# Patient Record
Sex: Female | Born: 1950 | Race: White | Hispanic: No | Marital: Married | Smoking: Former smoker
Health system: Southern US, Community
[De-identification: ages and names within clinical notes are randomized; demographics above are authoritative.]

## PROBLEM LIST (undated history)

## (undated) DIAGNOSIS — F419 Anxiety disorder, unspecified: Secondary | ICD-10-CM

## (undated) DIAGNOSIS — Z87442 Personal history of urinary calculi: Secondary | ICD-10-CM

## (undated) DIAGNOSIS — M1990 Unspecified osteoarthritis, unspecified site: Secondary | ICD-10-CM

## (undated) DIAGNOSIS — R011 Cardiac murmur, unspecified: Secondary | ICD-10-CM

## (undated) DIAGNOSIS — F32A Depression, unspecified: Secondary | ICD-10-CM

## (undated) DIAGNOSIS — E039 Hypothyroidism, unspecified: Secondary | ICD-10-CM

## (undated) DIAGNOSIS — I10 Essential (primary) hypertension: Secondary | ICD-10-CM

## (undated) DIAGNOSIS — I358 Other nonrheumatic aortic valve disorders: Secondary | ICD-10-CM

## (undated) DIAGNOSIS — N189 Chronic kidney disease, unspecified: Secondary | ICD-10-CM

## (undated) DIAGNOSIS — Z9889 Other specified postprocedural states: Secondary | ICD-10-CM

## (undated) DIAGNOSIS — K759 Inflammatory liver disease, unspecified: Secondary | ICD-10-CM

## (undated) DIAGNOSIS — C801 Malignant (primary) neoplasm, unspecified: Secondary | ICD-10-CM

## (undated) DIAGNOSIS — D649 Anemia, unspecified: Secondary | ICD-10-CM

## (undated) DIAGNOSIS — K219 Gastro-esophageal reflux disease without esophagitis: Secondary | ICD-10-CM

## (undated) DIAGNOSIS — R112 Nausea with vomiting, unspecified: Secondary | ICD-10-CM

## (undated) DIAGNOSIS — M549 Dorsalgia, unspecified: Secondary | ICD-10-CM

## (undated) DIAGNOSIS — E785 Hyperlipidemia, unspecified: Secondary | ICD-10-CM

## (undated) HISTORY — DX: Hyperlipidemia, unspecified: E78.5

## (undated) HISTORY — PX: TUBAL LIGATION: SHX77

## (undated) HISTORY — DX: Anxiety disorder, unspecified: F41.9

## (undated) HISTORY — PX: SPINE SURGERY: SHX786

## (undated) HISTORY — PX: TONSILLECTOMY: SUR1361

## (undated) HISTORY — DX: Anemia, unspecified: D64.9

---

## 2006-02-07 HISTORY — PX: ABDOMINAL HYSTERECTOMY: SHX81

## 2011-02-08 HISTORY — PX: BREAST SURGERY: SHX581

## 2011-04-18 DIAGNOSIS — E785 Hyperlipidemia, unspecified: Secondary | ICD-10-CM | POA: Insufficient documentation

## 2011-04-18 DIAGNOSIS — E039 Hypothyroidism, unspecified: Secondary | ICD-10-CM | POA: Insufficient documentation

## 2011-04-18 DIAGNOSIS — N209 Urinary calculus, unspecified: Secondary | ICD-10-CM | POA: Insufficient documentation

## 2011-06-16 DIAGNOSIS — K219 Gastro-esophageal reflux disease without esophagitis: Secondary | ICD-10-CM

## 2011-06-16 HISTORY — DX: Gastro-esophageal reflux disease without esophagitis: K21.9

## 2011-06-20 HISTORY — PX: BREAST SURGERY: SHX581

## 2011-10-26 DIAGNOSIS — N200 Calculus of kidney: Secondary | ICD-10-CM | POA: Insufficient documentation

## 2012-11-06 DIAGNOSIS — F32A Depression, unspecified: Secondary | ICD-10-CM

## 2012-11-06 HISTORY — DX: Depression, unspecified: F32.A

## 2013-07-02 DIAGNOSIS — K635 Polyp of colon: Secondary | ICD-10-CM | POA: Insufficient documentation

## 2016-02-08 DIAGNOSIS — M549 Dorsalgia, unspecified: Secondary | ICD-10-CM

## 2016-02-08 HISTORY — DX: Dorsalgia, unspecified: M54.9

## 2016-04-11 DIAGNOSIS — F419 Anxiety disorder, unspecified: Secondary | ICD-10-CM

## 2016-04-11 HISTORY — DX: Anxiety disorder, unspecified: F41.9

## 2016-10-18 DIAGNOSIS — D649 Anemia, unspecified: Secondary | ICD-10-CM

## 2016-10-18 HISTORY — DX: Anemia, unspecified: D64.9

## 2018-05-23 DIAGNOSIS — I358 Other nonrheumatic aortic valve disorders: Secondary | ICD-10-CM | POA: Insufficient documentation

## 2018-05-23 HISTORY — DX: Other nonrheumatic aortic valve disorders: I35.8

## 2020-04-10 DIAGNOSIS — N1832 Chronic kidney disease, stage 3b: Secondary | ICD-10-CM | POA: Insufficient documentation

## 2020-05-13 ENCOUNTER — Ambulatory Visit (INDEPENDENT_AMBULATORY_CARE_PROVIDER_SITE_OTHER): Payer: Medicare Other | Admitting: Orthopaedic Surgery

## 2020-05-13 ENCOUNTER — Ambulatory Visit (INDEPENDENT_AMBULATORY_CARE_PROVIDER_SITE_OTHER): Payer: Medicare Other

## 2020-05-13 VITALS — BP 144/79 | HR 92 | Ht 65.0 in | Wt 176.0 lb

## 2020-05-13 DIAGNOSIS — M1711 Unilateral primary osteoarthritis, right knee: Secondary | ICD-10-CM | POA: Insufficient documentation

## 2020-05-13 DIAGNOSIS — M25561 Pain in right knee: Secondary | ICD-10-CM

## 2020-05-13 NOTE — Progress Notes (Signed)
Office Visit Note   Patient: Vanessa [name]           Date of Birth: 1950/04/03           MRN: [id number] Visit Date: 05/13/2020              Requested by: No referring provider defined for this encounter. PCP: No primary care provider on file.   Assessment & Plan: Visit Diagnoses:  1. Acute pain of right knee   2. Unilateral primary osteoarthritis, right knee     Plan: Right knee injection performed which she tolerated well.  She will let us know she has persistent symptoms.  She has fairly significant knee osteoarthritis.  She likely may have some degenerative meniscal tearing as well.  Hopefully she will settle down with intra-articular injection since she cannot take anti-inflammatories.  Follow-Up Instructions: Return if symptoms worsen or fail to improve.   Orders:  Orders Placed This Encounter  Procedures  . XR KNEE 3 VIEW RIGHT   No orders of the defined types were placed in this encounter.     Procedures: Large Joint Inj: R knee on 05/14/2020 12:17 PM Indications: pain and joint swelling Details: 22 G 1.5 in needle, anterolateral approach  Arthrogram: No  Medications: 40 mg methylPREDNISolone acetate 40 MG/ML; 0.5 mL lidocaine 1 %; 4 mL bupivacaine 0.25 % Outcome: tolerated well, no immediate complications Procedure, treatment alternatives, risks and benefits explained, specific risks discussed. Consent was given by the patient. Immediately prior to procedure a time out was called to verify the correct patient, procedure, equipment, support staff and site/side marked as required. Patient was prepped and draped in the usual sterile fashion.       Clinical Data: No additional findings.   Subjective: Chief Complaint  Patient presents with  . Right Knee - Pain    HPI 70 year old female previous ACL reconstruction right knee more than 20 years ago twisted her knee over the weekend and suddenly could not weight-bear.  She had swelling in her knee has  gotten out crutches and has to use them for ambulation.  She is used Aspercreme Tylenol and aspirin.  She has some chronic kidney disease stage III-IV and avoids anti-inflammatories.  No true locking but swelling pain and catching and pain with weightbearing.  She denies chills or fever no history of gout.  Onset was sudden and occurred when she twisted her knee.  Review of Systems possible kidney disease since last seen all other systems noncontributory to HPI.   Objective: Vital Signs: BP (!) 144/79   Pulse 92   Ht 5\' 5"  (1.651 m)   Wt 176 lb (79.8 kg)   BMI 29.29 kg/m   Physical Exam Constitutional:      Appearance: She is well-developed.  HENT:     Head: Normocephalic.     Right Ear: External ear normal.     Left Ear: External ear normal.  Eyes:     Pupils: Pupils are equal, round, and reactive to light.  Neck:     Thyroid: No thyromegaly.     Trachea: No tracheal deviation.  Cardiovascular:     Rate and Rhythm: Normal rate.  Pulmonary:     Effort: Pulmonary effort is normal.  Abdominal:     Palpations: Abdomen is soft.  Skin:    General: Skin is warm and dry.  Neurological:     Mental Status: She is alert and oriented to person, place, and time.  Psychiatric:  Behavior: Behavior normal.     Ortho Exam there is a 2+ knee effusion.  Well-healed ACL incision.  Negative anterior drawer negative Lachman she does reach full extension she has pain with patellar compression and pain and crepitus with knee flexion extension.  Specialty Comments:  No specialty comments available.  Imaging: No results found.   PMFS History: Patient Active Problem List   Diagnosis Date Noted  . Unilateral primary osteoarthritis, right knee 05/13/2020   No past medical history on file.  No family history on file.   Social History   Occupational History  . Not on file  Tobacco Use  . Smoking status: Not on file  . Smokeless tobacco: Not on file  Substance and Sexual  Activity  . Alcohol use: Not on file  . Drug use: Not on file  . Sexual activity: Not on file

## 2020-05-14 DIAGNOSIS — M1711 Unilateral primary osteoarthritis, right knee: Secondary | ICD-10-CM

## 2020-05-14 MED ORDER — METHYLPREDNISOLONE ACETATE 40 MG/ML IJ SUSP
40.0000 mg | INTRAMUSCULAR | Status: AC | PRN
  Administered 2020-05-14: 40 mg via INTRA_ARTICULAR

## 2020-05-14 MED ORDER — BUPIVACAINE HCL 0.25 % IJ SOLN
4.0000 mL | INTRAMUSCULAR | Status: AC | PRN
  Administered 2020-05-14: 4 mL via INTRA_ARTICULAR

## 2020-05-14 MED ORDER — LIDOCAINE HCL 1 % IJ SOLN
0.5000 mL | INTRAMUSCULAR | Status: AC | PRN
  Administered 2020-05-14: .5 mL

## 2020-05-19 ENCOUNTER — Ambulatory Visit: Payer: Self-pay | Admitting: Orthopaedic Surgery

## 2020-05-25 DIAGNOSIS — I10 Essential (primary) hypertension: Secondary | ICD-10-CM | POA: Insufficient documentation

## 2020-05-25 DIAGNOSIS — F419 Anxiety disorder, unspecified: Secondary | ICD-10-CM | POA: Insufficient documentation

## 2020-09-30 ENCOUNTER — Other Ambulatory Visit: Payer: Self-pay

## 2020-09-30 ENCOUNTER — Ambulatory Visit (INDEPENDENT_AMBULATORY_CARE_PROVIDER_SITE_OTHER): Payer: Medicare Other

## 2020-09-30 ENCOUNTER — Ambulatory Visit (INDEPENDENT_AMBULATORY_CARE_PROVIDER_SITE_OTHER): Payer: Medicare Other | Admitting: Orthopedic Surgery

## 2020-09-30 ENCOUNTER — Encounter: Payer: Self-pay | Admitting: Orthopedic Surgery

## 2020-09-30 DIAGNOSIS — M25511 Pain in right shoulder: Secondary | ICD-10-CM

## 2020-09-30 NOTE — Progress Notes (Signed)
Office Visit Note   Patient: Vanessa [name]           Date of Birth: Aug 31, 1950           MRN: [id number] Visit Date: 09/30/2020 Requested by: No referring provider defined for this encounter. PCP: No primary care provider on file.  Subjective: Chief Complaint  Patient presents with   Right Shoulder - Pain    HPI: Vanessa [name] is a 70 year old patient with right shoulder pain.  Shoulder pain has been going on since April.  She was doing some stretching and felt a pop in her shoulder.  Pain comes and goes.  Cold and damp hair causes increased in the pain.  Describes painful range of motion.  Does not report frequent mechanical symptoms in the shoulder but localizes the pain to the bicipital groove region just above the pec tendon attachment site.  Extension and circumduction hurt the right shoulder.  Denies any neck pain or radicular symptoms.  Has a history of posterior lipoma removal.  Cannot take anti-inflammatories because of chronic renal disease.  Does take Tylenol and aspirin.              ROS: All systems reviewed are negative as they relate to the chief complaint within the history of present illness.  Patient denies  fevers or chills.   Assessment & Plan: Visit Diagnoses:  1. Right shoulder pain, unspecified chronicity     Plan: Impression is right shoulder pain which looks like biceps tendon related.  No Popeye deformity present with good rotator cuff strength and maintenance of passive range of motion.  Symptoms have been ongoing now for 4 months.  She has tried medication as well as a home exercise program of stretching as well as activity modification.  Nonetheless symptoms have been progressive and are worsening.  Plan MRI arthrogram of the right shoulder to evaluate that biceps tendon in the region of the top of the pec attachment.  Could also look at the intra-articular portion as well.  Follow-up after that study. Follow-Up Instructions: Return for after MRI.    Orders:  Orders Placed This Encounter  Procedures   XR Shoulder Right   MR SHOULDER RIGHT W CONTRAST   Arthrogram   No orders of the defined types were placed in this encounter.     Procedures: No procedures performed   Clinical Data: No additional findings.  Objective: Vital Signs: There were no vitals taken for this visit.  Physical Exam:   Constitutional: Patient appears well-developed HEENT:  Head: Normocephalic Eyes:EOM are normal Neck: Normal range of motion Cardiovascular: Normal rate Pulmonary/chest: Effort normal Neurologic: Patient is alert Skin: Skin is warm Psychiatric: Patient has normal mood and affect   Ortho Exam: Ortho exam demonstrates full active and passive range of motion of the shoulder.  Her passive range of motion is 60/95/170.  Rotator cuff strength is intact infraspinatus supraspinatus and subscap muscle testing with no discrete AC joint tenderness.  She is got a little bit more coarseness and grinding with internal and external rotation at 90 degrees of abduction right versus left.  Still no discrete weakness.  O'Brien's testing negative on the right negative on left.  She does have mild pain with resisted supination on the right compared to the left.  Specialty Comments:  No specialty comments available.  Imaging: XR Shoulder Right  Result Date: 09/30/2020 AP axillary outlet radiographs right shoulder reviewed.  Acromiohumeral distance normal.  No AC joint or glenohumeral joint arthritis.  No fracture or dislocation.  Visualized lung fields clear.  Normal radiographs right shoulder    PMFS History: Patient Active Problem List   Diagnosis Date Noted   Unilateral primary osteoarthritis, right knee 05/13/2020   History reviewed. No pertinent past medical history.  History reviewed. No pertinent family history.  History reviewed. No pertinent surgical history. Social History   Occupational History   Not on file  Tobacco Use    Smoking status: Not on file   Smokeless tobacco: Not on file  Substance and Sexual Activity   Alcohol use: Not on file   Drug use: Not on file   Sexual activity: Not on file

## 2020-10-01 ENCOUNTER — Encounter: Payer: Self-pay | Admitting: Orthopedic Surgery

## 2020-10-01 NOTE — Telephone Encounter (Signed)
Ok for no contrast pls call thx

## 2020-10-02 NOTE — Telephone Encounter (Signed)
Order has been changed to wo contrast and I cancelled order for arthogram

## 2020-10-26 ENCOUNTER — Inpatient Hospital Stay: Admission: RE | Admit: 2020-10-26 | Payer: Medicare Other | Source: Ambulatory Visit

## 2020-10-26 ENCOUNTER — Ambulatory Visit
Admission: RE | Admit: 2020-10-26 | Discharge: 2020-10-26 | Disposition: A | Discharge status: Home / Self Care | Payer: Medicare Other | Source: Ambulatory Visit | Attending: Orthopedic Surgery | Admitting: Orthopedic Surgery

## 2020-10-26 ENCOUNTER — Other Ambulatory Visit: Payer: Self-pay

## 2020-10-26 DIAGNOSIS — M25511 Pain in right shoulder: Secondary | ICD-10-CM

## 2020-11-11 ENCOUNTER — Other Ambulatory Visit: Payer: Self-pay

## 2020-11-11 ENCOUNTER — Ambulatory Visit (INDEPENDENT_AMBULATORY_CARE_PROVIDER_SITE_OTHER): Payer: Medicare Other | Admitting: Orthopedic Surgery

## 2020-11-11 DIAGNOSIS — M75121 Complete rotator cuff tear or rupture of right shoulder, not specified as traumatic: Secondary | ICD-10-CM | POA: Diagnosis not present

## 2020-11-15 ENCOUNTER — Encounter: Payer: Self-pay | Admitting: Orthopedic Surgery

## 2020-11-15 NOTE — Progress Notes (Signed)
Office Visit Note   Patient: Vanessa [name]           Date of Birth: 1950-07-30           MRN: [id number] Visit Date: 11/11/2020 Requested by: Berdie Ogren, [place],  [place] [address] PCP: Hix, Jonna Munro, MD  Subjective: Chief Complaint  Patient presents with   Other    Scan review    HPI: Lazette is a patient with right shoulder pain.  Since she was last seen she has had an MRI scan of the right shoulder which demonstrates subscapularis torn and retracted up to 3 cm.  Severe fatty atrophy of the subscapularis is present.  She also has full-thickness retracted tear of the supraspinatus with maximal retraction around a centimeter and a half.  Biceps tendon is dislocated medially.  Moderate effusion is present.  Patient reports he likes to golf walk and do exercise classes and Pilates.  Pain is a more significant component of her symptomatology than weakness.  She cannot take anti-inflammatories.  She does take aspirin and Tylenol for her symptoms.              ROS: All systems reviewed are negative as they relate to the chief complaint within the history of present illness.  Patient denies  fevers or chills.   Assessment & Plan: Visit Diagnoses:  1. Complete tear of right rotator cuff, unspecified whether traumatic     Plan: Impression is right shoulder rotator cuff tear anterior superior pathology.  Biceps tendon dislocated medially.  Plan arthroscopy with debridement and release of the biceps tendon with mini open rotator cuff tear repair of the supraspinatus.  Subscapularis is retracted and unlikely to be able to be mobilized to the point to achieve repair.  Patient understands the risk and benefits.  I think if we can get a watertight/operative repair of the supraspinatus then she would have reasonable but not perfect function.  The risk and benefits of the procedure are discussed including not limited to infection nerve vessel damage shoulder  stiffness as well as possibility of further surgery being required.  All questions answered.  Follow-Up Instructions: No follow-ups on file.   Orders:  No orders of the defined types were placed in this encounter.  No orders of the defined types were placed in this encounter.     Procedures: No procedures performed   Clinical Data: No additional findings.  Objective: Vital Signs: There were no vitals taken for this visit.  Physical Exam:   Constitutional: Patient appears well-developed HEENT:  Head: Normocephalic Eyes:EOM are normal Neck: Normal range of motion Cardiovascular: Normal rate Pulmonary/chest: Effort normal Neurologic: Patient is alert Skin: Skin is warm Psychiatric: Patient has normal mood and affect   Ortho Exam: Ortho exam demonstrates good cervical spine range of motion.  Motor or sensory function to the hand is intact.  Passive range of motion on the right is 85/90/170.  She does have slight weakness to the subscap testing but not as much as 1 would expect based on her findings.  She has good infraspinatus strength and 4 out of 5 supraspinatus strength.  No Popeye deformity is present.  No AC joint tenderness is present.  Specialty Comments:  No specialty comments available.  Imaging: No results found.   PMFS History: Patient Active Problem List   Diagnosis Date Noted   Unilateral primary osteoarthritis, right knee 05/13/2020   No past medical history on file.  No family  history on file.  No past surgical history on file. Social History   Occupational History   Not on file  Tobacco Use   Smoking status: Not on file   Smokeless tobacco: Not on file  Substance and Sexual Activity   Alcohol use: Not on file   Drug use: Not on file   Sexual activity: Not on file

## 2021-01-05 ENCOUNTER — Encounter: Payer: Self-pay | Admitting: Orthopedic Surgery

## 2021-01-05 NOTE — Telephone Encounter (Signed)
Depending on where she is done  Should be outpt tho  - if cone she will need to do all that beforehand

## 2021-01-12 ENCOUNTER — Encounter (HOSPITAL_COMMUNITY): Payer: Self-pay

## 2021-01-12 NOTE — Pre-Procedure Instructions (Signed)
Surgical Instructions    Your procedure is scheduled on Friday, December 23rd.  Report to [facility] Main Entrance "A" at 05:30 A.M., then check in with the Admitting office.  Call this number if you have problems the morning of surgery:  [phone]   If you have any questions prior to your surgery date call [phone]: Open Monday-Friday 8am-4pm    Remember:  Do not eat after midnight the night before your surgery  You may drink clear liquids until 04:30 AM the morning of your surgery.   Clear liquids allowed are: Water, Non-Citrus Juices (without pulp), Carbonated Beverages, Clear Tea, Black Coffee Only, and Gatorade  Patient Instructions  The night before surgery:  No food after midnight. ONLY clear liquids after midnight  The day of surgery (if you do NOT have diabetes):  Drink ONE (1) Pre-Surgery Clear Ensure by 04:30 AM the morning of surgery. Drink in one sitting. Do not sip.  This drink was given to you during your hospital  pre-op appointment visit.  Nothing else to drink after completing the  Pre-Surgery Clear Ensure.          If you have questions, please contact your surgeon's office.     Take these medicines the morning of surgery with A SIP OF WATER  atorvastatin (LIPITOR) diltiazem (CARDIZEM CD)  famotidine (PEPCID) hydrALAZINE (APRESOLINE)  levothyroxine (SYNTHROID)  venlafaxine XR (EFFEXOR-XR)    If needed: acetaminophen (TYLENOL)  loratadine (CLARITIN)  ondansetron (ZOFRAN-ODT)   As of today, STOP taking any Aspirin (unless otherwise instructed by your surgeon) Aleve, Naproxen, Ibuprofen, Motrin, Advil, Goody's, BC's, all herbal medications, fish oil, and all vitamins.                     Do NOT Smoke (Tobacco/Vaping) or drink Alcohol 24 hours prior to your procedure.  If you use a CPAP at night, you may bring all equipment for your overnight stay.   Contacts, glasses, piercing's, hearing aid's, dentures or partials may not be worn into  surgery, please bring cases for these belongings.    For patients admitted to the hospital, discharge time will be determined by your treatment team.   Patients discharged the day of surgery will not be allowed to drive home, and someone needs to stay with them for 24 hours.  NO VISITORS WILL BE ALLOWED IN PRE-OP WHERE PATIENTS GET READY FOR SURGERY.  ONLY 1 SUPPORT PERSON MAY BE PRESENT IN THE WAITING ROOM WHILE YOU ARE IN SURGERY.  IF YOU ARE TO BE ADMITTED, ONCE YOU ARE IN YOUR ROOM YOU WILL BE ALLOWED TWO (2) VISITORS.  Minor children may have two parents present. Special consideration for safety and communication needs will be reviewed on a case by case basis.   Special instructions:   [place]- Preparing For Surgery  Before surgery, you can play an important role. Because skin is not sterile, your skin needs to be as free of germs as possible. You can reduce the number of germs on your skin by washing with CHG (chlorahexidine gluconate) Soap before surgery.  CHG is an antiseptic cleaner which kills germs and bonds with the skin to continue killing germs even after washing.    Oral Hygiene is also important to reduce your risk of infection.  Remember - BRUSH YOUR TEETH THE MORNING OF SURGERY WITH YOUR REGULAR TOOTHPASTE  Please do not use if you have an allergy to CHG or antibacterial soaps. If your skin becomes reddened/irritated stop using the  CHG.  Do not shave (including legs and underarms) for at least 48 hours prior to first CHG shower. It is OK to shave your face.  Please follow these instructions carefully.   Shower the NIGHT BEFORE SURGERY and the MORNING OF SURGERY  If you chose to wash your hair, wash your hair first as usual with your normal shampoo.  After you shampoo, rinse your hair and body thoroughly to remove the shampoo.  Use CHG Soap as you would any other liquid soap. You can apply CHG directly to the skin and wash gently with a scrungie or a clean washcloth.    Apply the CHG Soap to your body ONLY FROM THE NECK DOWN.  Do not use on open wounds or open sores. Avoid contact with your eyes, ears, mouth and genitals (private parts). Wash Face and genitals (private parts)  with your normal soap.   Wash thoroughly, paying special attention to the area where your surgery will be performed.  Thoroughly rinse your body with warm water from the neck down.  DO NOT shower/wash with your normal soap after using and rinsing off the CHG Soap.  Pat yourself dry with a CLEAN TOWEL.  Wear CLEAN PAJAMAS to bed the night before surgery  Place CLEAN SHEETS on your bed the night before your surgery  DO NOT SLEEP WITH PETS.   Day of Surgery: Shower with CHG soap. Do not wear jewelry, make up, nail polish, gel polish, artificial nails, or any other type of covering on natural nails including finger and toenails. If patients have artificial nails, gel coating, etc. that need to be removed by a nail salon please have this removed prior to surgery. Surgery may need to be canceled/delayed if the surgeon/ anesthesia feels like the patient is unable to be adequately monitored. Do not wear lotions, powders, perfumes, or deodorant. Do not shave 48 hours prior to surgery.   Do not bring valuables to the hospital. [facility] is not responsible for any belongings or valuables. Wear Clean/Comfortable clothing the morning of surgery Remember to brush your teeth WITH YOUR REGULAR TOOTHPASTE.   Please read over the following fact sheets that you were given.   3 days prior to your procedure or After your COVID test   You are not required to quarantine however you are required to wear a well-fitting mask when you are out and around people not in your household. If your mask becomes wet or soiled, replace with a new one.   Wash your hands often with soap and water for 20 seconds or clean your hands with an alcohol-based hand sanitizer that contains at least 60%  alcohol.   Do not share personal items.   Notify your provider:  o if you are in close contact with someone who has COVID  o or if you develop a fever of 100.4 or greater, sneezing, cough, sore throat, shortness of breath or body aches.

## 2021-01-13 ENCOUNTER — Encounter (HOSPITAL_COMMUNITY)
Admission: RE | Admit: 2021-01-13 | Discharge: 2021-01-13 | Disposition: A | Discharge status: Home / Self Care | Payer: Medicare Other | Source: Ambulatory Visit | Attending: Orthopedic Surgery | Admitting: Orthopedic Surgery

## 2021-01-13 ENCOUNTER — Telehealth: Payer: Self-pay | Admitting: Orthopedic Surgery

## 2021-01-13 ENCOUNTER — Other Ambulatory Visit: Payer: Self-pay

## 2021-01-13 ENCOUNTER — Encounter: Payer: Self-pay | Admitting: Orthopedic Surgery

## 2021-01-13 ENCOUNTER — Encounter (HOSPITAL_COMMUNITY): Payer: Self-pay

## 2021-01-13 VITALS — BP 146/65 | HR 95 | Temp 98.4°F | Resp 17 | Ht 65.0 in | Wt 167.0 lb

## 2021-01-13 DIAGNOSIS — Z87891 Personal history of nicotine dependence: Secondary | ICD-10-CM | POA: Diagnosis not present

## 2021-01-13 DIAGNOSIS — Z01818 Encounter for other preprocedural examination: Secondary | ICD-10-CM | POA: Insufficient documentation

## 2021-01-13 DIAGNOSIS — K219 Gastro-esophageal reflux disease without esophagitis: Secondary | ICD-10-CM | POA: Diagnosis not present

## 2021-01-13 DIAGNOSIS — N183 Chronic kidney disease, stage 3 unspecified: Secondary | ICD-10-CM | POA: Insufficient documentation

## 2021-01-13 DIAGNOSIS — I129 Hypertensive chronic kidney disease with stage 1 through stage 4 chronic kidney disease, or unspecified chronic kidney disease: Secondary | ICD-10-CM | POA: Diagnosis not present

## 2021-01-13 DIAGNOSIS — E039 Hypothyroidism, unspecified: Secondary | ICD-10-CM | POA: Diagnosis not present

## 2021-01-13 HISTORY — DX: Other specified postprocedural states: Z98.890

## 2021-01-13 HISTORY — DX: Personal history of urinary calculi: Z87.442

## 2021-01-13 HISTORY — DX: Cardiac murmur, unspecified: R01.1

## 2021-01-13 HISTORY — DX: Chronic kidney disease, unspecified: N18.9

## 2021-01-13 HISTORY — DX: Gastro-esophageal reflux disease without esophagitis: K21.9

## 2021-01-13 HISTORY — DX: Essential (primary) hypertension: I10

## 2021-01-13 HISTORY — DX: Hypothyroidism, unspecified: E03.9

## 2021-01-13 HISTORY — DX: Inflammatory liver disease, unspecified: K75.9

## 2021-01-13 HISTORY — DX: Depression, unspecified: F32.A

## 2021-01-13 HISTORY — DX: Unspecified osteoarthritis, unspecified site: M19.90

## 2021-01-13 HISTORY — DX: Nausea with vomiting, unspecified: R11.2

## 2021-01-13 HISTORY — DX: Malignant (primary) neoplasm, unspecified: C80.1

## 2021-01-13 HISTORY — DX: Other nonrheumatic aortic valve disorders: I35.8

## 2021-01-13 LAB — COMPREHENSIVE METABOLIC PANEL
ALT: 27 U/L (ref 0–44)
AST: 28 U/L (ref 15–41)
Albumin: 4 g/dL (ref 3.5–5.0)
Alkaline Phosphatase: 69 U/L (ref 38–126)
Anion gap: 8 (ref 5–15)
BUN: 25 mg/dL — ABNORMAL HIGH (ref 8–23)
CO2: 25 mmol/L (ref 22–32)
Calcium: 9.7 mg/dL (ref 8.9–10.3)
Chloride: 107 mmol/L (ref 98–111)
Creatinine, Ser: 1.53 mg/dL — ABNORMAL HIGH (ref 0.44–1.00)
GFR, Estimated: 37 mL/min — ABNORMAL LOW (ref >60)
Glucose, Bld: 120 mg/dL — ABNORMAL HIGH (ref 70–99)
Potassium: 4.4 mmol/L (ref 3.5–5.1)
Sodium: 140 mmol/L (ref 135–145)
Total Bilirubin: 0.4 mg/dL (ref 0.3–1.2)
Total Protein: 6.8 g/dL (ref 6.5–8.1)

## 2021-01-13 LAB — CBC
HCT: 35.6 % — ABNORMAL LOW (ref 36.0–46.0)
Hemoglobin: 11.7 g/dL — ABNORMAL LOW (ref 12.0–15.0)
MCH: 29 pg (ref 26.0–34.0)
MCHC: 32.9 g/dL (ref 30.0–36.0)
MCV: 88.1 fL (ref 80.0–100.0)
Platelets: 334 10*3/uL (ref 150–400)
RBC: 4.04 MIL/uL (ref 3.87–5.11)
RDW: 12.8 % (ref 11.5–15.5)
WBC: 9.3 10*3/uL (ref 4.0–10.5)
nRBC: 0 % (ref 0.0–0.2)

## 2021-01-13 NOTE — Telephone Encounter (Signed)
Patient called advised she is having surgery 01-29-2021 and want to know when should she stop taking Aspirin? The number to contact patient is [phone]

## 2021-01-13 NOTE — Telephone Encounter (Signed)
5 days prior to surgeyr

## 2021-01-13 NOTE — Telephone Encounter (Signed)
Patient is scheduled to have a right shoulder scope preformed on 01/29/2021 and would like to know when she should stop taking ASA

## 2021-01-13 NOTE — Progress Notes (Signed)
PCP: Jonna Munro Hix, MD Cardiologist: denies  EKG:01/13/21 CXR: na ECHO: 12/04/17 Stress Test: denies Cardiac Cath: denies  Fasting Blood Sugar- na Checks Blood Sugar_na__ times a day  ASA: Patient will call office for instructions Blood Thinner: No  OSA/CPAP: No  Covid test not needed - Ambulatory  Anesthesia Review: Yes, heart murmur. Had Echo due to murmur but patient states did not see cardiologist and no follow up required.   Patient denies shortness of breath, fever, cough, and chest pain at PAT appointment.  Patient verbalized understanding of instructions provided today at the PAT appointment.  Patient asked to review instructions at home and day of surgery.

## 2021-01-14 ENCOUNTER — Encounter (HOSPITAL_COMMUNITY): Payer: Self-pay

## 2021-01-14 NOTE — Telephone Encounter (Signed)
Contacted patient and made her aware of lukes response. Patient understood and had no further questions or concerns.

## 2021-01-14 NOTE — Telephone Encounter (Signed)
Vanessa [name] called her about this this AM

## 2021-01-14 NOTE — Anesthesia Preprocedure Evaluation (Addendum)
Anesthesia Evaluation  Patient identified by MRN, date of birth, ID band Patient awake    Reviewed: Allergy & Precautions, NPO status , Patient's Chart, lab work & pertinent test results  History of Anesthesia Complications (+) PONV  Airway Mallampati: I  TM Distance: >3 FB Neck ROM: Full    Dental no notable dental hx. (+) Teeth Intact, Dental Advisory Given   Pulmonary former smoker,    Pulmonary exam normal breath sounds clear to auscultation       Cardiovascular hypertension, Normal cardiovascular exam Rhythm:Regular Rate:Normal  01/13/21 EKG SR R77  Echo 12/04/17 ([facility]): SUMMARY  The left ventricular size is normal.   Left ventricular systolic function is normal.  LV ejection fraction = 65-70%.     Neuro/Psych negative neurological ROS     GI/Hepatic GERD  Medicated and Controlled,  Endo/Other  Hypothyroidism   Renal/GU Renal InsufficiencyRenal diseaseCKD Stage 3 Lab Results      Component                Value               Date                      CREATININE               1.53 (H)            01/13/2021              BUN                      25 (H)              01/13/2021                      K                        4.4                 01/13/2021                    Hx of endometrial CA    Musculoskeletal  (+) Arthritis ,   Abdominal   Peds  Hematology Lab Results      Component                Value               Date                      WBC                      9.3                 01/13/2021                HGB                      11.7 (L)            01/13/2021                HCT                      35.6 (L)            01/13/2021  MCV                      88.1                01/13/2021                PLT                      334                 01/13/2021              Anesthesia Other Findings All: codeine Lisinopril Morphine  Reproductive/Obstetrics                            Anesthesia Physical Anesthesia Plan  ASA: 2  Anesthesia Plan: General and Regional   Post-op Pain Management: Regional block, Minimal or no pain anticipated and Ofirmev IV (intra-op)   Induction: Intravenous  PONV Risk Score and Plan: 4 or greater and Scopolamine patch - Pre-op, Midazolam, Dexamethasone, Ondansetron and Treatment may vary due to age or medical condition  Airway Management Planned: Oral ETT  Additional Equipment: None  Intra-op Plan:   Post-operative Plan: Extubation in OR  Informed Consent: I have reviewed the patients History and Physical, chart, labs and discussed the procedure including the risks, benefits and alternatives for the proposed anesthesia with the patient or authorized representative who has indicated his/her understanding and acceptance.     Dental advisory given  Plan Discussed with:   Anesthesia Plan Comments: (PAT note written 01/14/2021 by Myra Gianotti, PA-C.  GA w ETT w R ISB w Exparel )      Anesthesia Quick Evaluation

## 2021-01-14 NOTE — Progress Notes (Signed)
Anesthesia Chart Review:  Case: [id number] Date/Time: 01/29/21 0715   Procedure: RIGHT SHOULDER ARTHROSCOPY, DEBRIDEMENT, WITH MINI OPEN ROTATOR CUFF TEAR REPAIR & BICEPS TENODESIS (Right)   Anesthesia type: General   Pre-op diagnosis: right shoulder rotator cuff tear, biceps subluxation   Location: [facility] OR ROOM 05 / [place] [facility]   Surgeons: Meredith Pel, MD       DISCUSSION: Patient is a 70 year old female scheduled for the above procedure.  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    Annual follow-up with PCP Dr. Margorie John on 11/25/20. HTN controlled. Compliant with medications. 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 On statin therapy. CKD stable and followed by nephrology. One year follow-up planned.  He notes she is scheduled for right shoulder surgery.   She is told aspirin for 5 days prior to surgery per Ortho.  She denied shortness of breath, fever, cough, chest pain at PAT RN visit.  EKG normal.  Creatinine 1.53 and consistent with previous labs.  Anesthesia team to evaluate on the day of surgery.   VS: BP (!) 146/65   Pulse 95   Temp 36.9 C (Oral)   Resp 17   Ht _0  (1.651 m)   Wt 75.8 kg   SpO2 99%   BMI 27.79 kg/m    PROVIDERS: Hix, Jonna Munro, MD is PCP ([facility]). Last visit 11/25/20 for annual wellness visit.  Doralee Albino, MD is nephrologist ([facility]). Last visit 09/23/20 for follow-up CKD stage III.  Renal function ("eGFR close to 40%") and hemoglobin stable Howard-McNatt, Marissa, MD is surgical oncologist ([facility]). Last visit 09/22/20.  Mammogram and exam are normal.  1 year follow-up planned.   LABS:  Labs reviewed: Acceptable for surgery. (all labs ordered are listed, but only abnormal results are displayed)  Labs Reviewed  CBC - Abnormal; Notable for the following components:      Result Value   Hemoglobin 11.7 (*)    HCT 35.6 (*)    All other components within normal limits  COMPREHENSIVE METABOLIC PANEL - Abnormal; Notable for the following components:   Glucose, Bld 120 (*)    BUN 25 (*)    Creatinine, Ser 1.53 (*)    GFR, Estimated 37 (*)    All other components within normal limits  Cr 1.47 11/23/20 and 1.54 09/21/20.   IMAGES: MRI Right shoulder 10/26/20: IMPRESSION: 1. Full-thickness retracted tears involving the supraspinatus and subscapularis tendons as detailed above. 2. Intact long head biceps tendon and glenoid labrum. The biceps tendon is dislocated medially. 3. Moderate AC joint degenerative changes and mild subacromial spurring. 4. Mild glenohumeral joint degenerative changes with moderate size joint effusion.    EKG: 01/13/21: NSR   CV: Echo 12/04/17 ([facility]): SUMMARY  The left ventricular size is normal.   Left ventricular systolic function is normal.  LV ejection fraction = 65-70%.   Left ventricular filling pattern is prolonged relaxation.  The right ventricle is normal in size and function.  There is aortic valve sclerosis.  Cardiac 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 but no obvious stenosis of regurgitation  seen  There was insufficient TR detected to calculate RV systolic pressure.  Estimated right atrial pressure is 5 mmHg..  Trivial to small mostly posteriorly located  pericardial effusion of no  hemodynamic significance  There is no comparison study available.    Past Medical History:  Diagnosis Date   Aortic valve sclerosis    Arthritis    osteoarthritis   Cancer ([facility])    endometrial   Chronic kidney disease    stage 3   Depression    GERD (gastroesophageal reflux disease)    Heart murmur    AV sclerosis; valves not well  visualized but no obvious stenosis or regurgitation 12/04/17 echo ([place])   Hepatitis    History of kidney stones    Hypertension    Hypothyroidism    PONV (postoperative nausea and vomiting)     Past Surgical History:  Procedure Laterality Date   ABDOMINAL HYSTERECTOMY  2008   BREAST SURGERY Right 06/20/2011   lumpectomy for "atypical ductal hyperplasia"   TONSILLECTOMY     TUBAL LIGATION      MEDICATIONS:  acetaminophen (TYLENOL) 650 MG CR tablet   aspirin EC 325 MG tablet   atorvastatin (LIPITOR) 40 MG tablet   Cyanocobalamin (B-12 PO)   diltiazem (CARDIZEM CD) 360 MG 24 hr capsule   famotidine (PEPCID) 20 MG tablet   furosemide (LASIX) 20 MG tablet   hydrALAZINE (APRESOLINE) 100 MG tablet   levothyroxine (SYNTHROID) 150 MCG tablet   loratadine (CLARITIN) 10 MG tablet   mirtazapine (REMERON) 15 MG tablet   Multiple Vitamins-Minerals (HAIR SKIN AND NAILS FORMULA) TABS   ondansetron (ZOFRAN-ODT) 8 MG disintegrating tablet   scopolamine (TRANSDERM-SCOP) 1 MG/3DAYS   sodium bicarbonate 650 MG tablet   venlafaxine XR (EFFEXOR-XR) 37.5 MG 24 hr capsule   VITAMIN D PO   No current facility-administered medications for this encounter.    Myra Gianotti, PA-C Surgical Short Stay/Anesthesiology [facility] Phone [phone] [facility] Phone [phone] 01/14/2021 6:14 PM

## 2021-01-29 ENCOUNTER — Ambulatory Visit (HOSPITAL_COMMUNITY)
Admission: RE | Admit: 2021-01-29 | Discharge: 2021-01-29 | Disposition: A | Discharge status: Home / Self Care | Payer: Medicare Other | Attending: Orthopedic Surgery | Admitting: Orthopedic Surgery

## 2021-01-29 ENCOUNTER — Encounter (HOSPITAL_COMMUNITY)
Admission: RE | Disposition: A | Discharge status: Home / Self Care | Payer: Self-pay | Source: Home / Self Care | Attending: Orthopedic Surgery

## 2021-01-29 ENCOUNTER — Other Ambulatory Visit: Payer: Self-pay

## 2021-01-29 ENCOUNTER — Ambulatory Visit (HOSPITAL_COMMUNITY): Payer: Medicare Other

## 2021-01-29 ENCOUNTER — Ambulatory Visit (HOSPITAL_COMMUNITY): Payer: Medicare Other | Admitting: Vascular Surgery

## 2021-01-29 ENCOUNTER — Ambulatory Visit (HOSPITAL_COMMUNITY): Payer: Medicare Other | Admitting: Anesthesiology

## 2021-01-29 ENCOUNTER — Encounter (HOSPITAL_COMMUNITY): Payer: Self-pay | Admitting: Orthopedic Surgery

## 2021-01-29 DIAGNOSIS — M7521 Bicipital tendinitis, right shoulder: Secondary | ICD-10-CM

## 2021-01-29 DIAGNOSIS — Z01818 Encounter for other preprocedural examination: Secondary | ICD-10-CM

## 2021-01-29 DIAGNOSIS — Z87891 Personal history of nicotine dependence: Secondary | ICD-10-CM | POA: Insufficient documentation

## 2021-01-29 DIAGNOSIS — M75121 Complete rotator cuff tear or rupture of right shoulder, not specified as traumatic: Secondary | ICD-10-CM | POA: Diagnosis present

## 2021-01-29 DIAGNOSIS — S43431A Superior glenoid labrum lesion of right shoulder, initial encounter: Secondary | ICD-10-CM | POA: Diagnosis not present

## 2021-01-29 DIAGNOSIS — M94211 Chondromalacia, right shoulder: Secondary | ICD-10-CM | POA: Diagnosis not present

## 2021-01-29 DIAGNOSIS — Z419 Encounter for procedure for purposes other than remedying health state, unspecified: Secondary | ICD-10-CM

## 2021-01-29 HISTORY — PX: SHOULDER ARTHROSCOPY WITH ROTATOR CUFF REPAIR AND SUBACROMIAL DECOMPRESSION: SHX5686

## 2021-01-29 SURGERY — SHOULDER ARTHROSCOPY WITH ROTATOR CUFF REPAIR AND SUBACROMIAL DECOMPRESSION
Anesthesia: Regional | Laterality: Right

## 2021-01-29 MED ORDER — ONDANSETRON HCL 4 MG/2ML IJ SOLN: 4.0000 mg | Freq: Once | INTRAMUSCULAR | Status: DC | PRN

## 2021-01-29 MED ORDER — OXYCODONE HCL 5 MG PO TABS: 5.0000 mg | ORAL_TABLET | ORAL | 0 refills | Status: DC | PRN

## 2021-01-29 MED ORDER — LIDOCAINE 2% (20 MG/ML) 5 ML SYRINGE
INTRAMUSCULAR | Status: AC
  Filled 2021-01-29: qty 5

## 2021-01-29 MED ORDER — CHLORHEXIDINE GLUCONATE 0.12 % MT SOLN
OROMUCOSAL | Status: AC
  Filled 2021-01-29: qty 15

## 2021-01-29 MED ORDER — PHENYLEPHRINE 40 MCG/ML (10ML) SYRINGE FOR IV PUSH (FOR BLOOD PRESSURE SUPPORT)
PREFILLED_SYRINGE | INTRAVENOUS | Status: DC | PRN
  Administered 2021-01-29: 80 ug via INTRAVENOUS

## 2021-01-29 MED ORDER — ACETAMINOPHEN 10 MG/ML IV SOLN: 1000.0000 mg | Freq: Once | INTRAVENOUS | Status: DC | PRN

## 2021-01-29 MED ORDER — PROPOFOL 10 MG/ML IV BOLUS
INTRAVENOUS | Status: DC | PRN
  Administered 2021-01-29: 140 mg via INTRAVENOUS
  Administered 2021-01-29: 50 mg via INTRAVENOUS

## 2021-01-29 MED ORDER — EPHEDRINE 5 MG/ML INJ
INTRAVENOUS | Status: AC
  Filled 2021-01-29: qty 5

## 2021-01-29 MED ORDER — BUPIVACAINE HCL (PF) 0.5 % IJ SOLN
INTRAMUSCULAR | Status: DC | PRN
  Administered 2021-01-29: 20 mL via PERINEURAL

## 2021-01-29 MED ORDER — LIDOCAINE 2% (20 MG/ML) 5 ML SYRINGE
INTRAMUSCULAR | Status: DC | PRN
  Administered 2021-01-29: 100 mg via INTRAVENOUS

## 2021-01-29 MED ORDER — PHENYLEPHRINE 40 MCG/ML (10ML) SYRINGE FOR IV PUSH (FOR BLOOD PRESSURE SUPPORT)
PREFILLED_SYRINGE | INTRAVENOUS | Status: AC
  Filled 2021-01-29: qty 10

## 2021-01-29 MED ORDER — OXYCODONE HCL 5 MG/5ML PO SOLN: 5.0000 mg | Freq: Once | ORAL | Status: DC | PRN

## 2021-01-29 MED ORDER — POVIDONE-IODINE 10 % EX SWAB
2.0000 "application " | Freq: Once | CUTANEOUS | Status: AC
  Administered 2021-01-29: 2 via TOPICAL

## 2021-01-29 MED ORDER — ROCURONIUM BROMIDE 10 MG/ML (PF) SYRINGE
PREFILLED_SYRINGE | INTRAVENOUS | Status: AC
  Filled 2021-01-29: qty 10

## 2021-01-29 MED ORDER — ONDANSETRON HCL 4 MG/2ML IJ SOLN
INTRAMUSCULAR | Status: DC | PRN
  Administered 2021-01-29: 4 mg via INTRAVENOUS

## 2021-01-29 MED ORDER — EPINEPHRINE PF 1 MG/ML IJ SOLN
INTRAMUSCULAR | Status: AC
  Filled 2021-01-29: qty 1

## 2021-01-29 MED ORDER — EPINEPHRINE PF 1 MG/ML IJ SOLN
INTRAMUSCULAR | Status: DC | PRN
  Administered 2021-01-29: 1 mg

## 2021-01-29 MED ORDER — BUPIVACAINE HCL (PF) 0.25 % IJ SOLN
INTRAMUSCULAR | Status: AC
  Filled 2021-01-29: qty 30

## 2021-01-29 MED ORDER — MIDAZOLAM HCL 5 MG/5ML IJ SOLN
INTRAMUSCULAR | Status: DC | PRN
Start: 2021-01-29 — End: 2021-01-29
  Administered 2021-01-29: 2 mg via INTRAVENOUS

## 2021-01-29 MED ORDER — FENTANYL CITRATE (PF) 250 MCG/5ML IJ SOLN
INTRAMUSCULAR | Status: DC | PRN
  Administered 2021-01-29: 25 ug via INTRAVENOUS
  Administered 2021-01-29 (×2): 50 ug via INTRAVENOUS

## 2021-01-29 MED ORDER — OXYCODONE HCL 5 MG PO TABS: 5.0000 mg | ORAL_TABLET | Freq: Once | ORAL | Status: DC | PRN

## 2021-01-29 MED ORDER — FENTANYL CITRATE (PF) 100 MCG/2ML IJ SOLN: 25.0000 ug | INTRAMUSCULAR | Status: DC | PRN

## 2021-01-29 MED ORDER — CEFAZOLIN SODIUM-DEXTROSE 2-4 GM/100ML-% IV SOLN
2.0000 g | INTRAVENOUS | Status: AC
  Administered 2021-01-29: 08:00:00 2 g via INTRAVENOUS
  Filled 2021-01-29: qty 100

## 2021-01-29 MED ORDER — ONDANSETRON HCL 4 MG/2ML IJ SOLN
INTRAMUSCULAR | Status: AC
  Filled 2021-01-29: qty 2

## 2021-01-29 MED ORDER — POVIDONE-IODINE 7.5 % EX SOLN: Freq: Once | CUTANEOUS | Status: DC

## 2021-01-29 MED ORDER — SCOPOLAMINE 1 MG/3DAYS TD PT72
1.0000 | MEDICATED_PATCH | TRANSDERMAL | Status: DC
  Administered 2021-01-29: 06:00:00 1.5 mg via TRANSDERMAL
  Filled 2021-01-29: qty 1

## 2021-01-29 MED ORDER — EPHEDRINE SULFATE-NACL 50-0.9 MG/10ML-% IV SOSY
PREFILLED_SYRINGE | INTRAVENOUS | Status: DC | PRN
  Administered 2021-01-29: 10 mg via INTRAVENOUS
  Administered 2021-01-29 (×2): 5 mg via INTRAVENOUS

## 2021-01-29 MED ORDER — PROPOFOL 10 MG/ML IV BOLUS
INTRAVENOUS | Status: AC
  Filled 2021-01-29: qty 20

## 2021-01-29 MED ORDER — LACTATED RINGERS IV SOLN: INTRAVENOUS | Status: DC | PRN

## 2021-01-29 MED ORDER — DEXMEDETOMIDINE (PRECEDEX) IN NS 20 MCG/5ML (4 MCG/ML) IV SYRINGE
PREFILLED_SYRINGE | INTRAVENOUS | Status: DC | PRN
  Administered 2021-01-29: 4 ug via INTRAVENOUS

## 2021-01-29 MED ORDER — ROCURONIUM BROMIDE 10 MG/ML (PF) SYRINGE
PREFILLED_SYRINGE | INTRAVENOUS | Status: DC | PRN
  Administered 2021-01-29: 70 mg via INTRAVENOUS
  Administered 2021-01-29: 10 mg via INTRAVENOUS

## 2021-01-29 MED ORDER — SODIUM CHLORIDE 0.9 % IR SOLN
Status: DC | PRN
  Administered 2021-01-29 (×2): 3000 mL

## 2021-01-29 MED ORDER — SUGAMMADEX SODIUM 200 MG/2ML IV SOLN
INTRAVENOUS | Status: DC | PRN
  Administered 2021-01-29: 200 mg via INTRAVENOUS

## 2021-01-29 MED ORDER — FENTANYL CITRATE (PF) 250 MCG/5ML IJ SOLN
INTRAMUSCULAR | Status: AC
  Filled 2021-01-29: qty 5

## 2021-01-29 MED ORDER — PHENYLEPHRINE HCL-NACL 20-0.9 MG/250ML-% IV SOLN
INTRAVENOUS | Status: DC | PRN
  Administered 2021-01-29: 50 ug/min via INTRAVENOUS

## 2021-01-29 MED ORDER — BUPIVACAINE LIPOSOME 1.3 % IJ SUSP
INTRAMUSCULAR | Status: DC | PRN
  Administered 2021-01-29: 10 mL via PERINEURAL

## 2021-01-29 MED ORDER — MIDAZOLAM HCL 2 MG/2ML IJ SOLN
INTRAMUSCULAR | Status: AC
  Filled 2021-01-29: qty 2

## 2021-01-29 MED ORDER — ASPIRIN 81 MG PO CHEW: 81.0000 mg | CHEWABLE_TABLET | Freq: Every day | ORAL | 0 refills | Status: DC

## 2021-01-29 MED ORDER — DEXAMETHASONE SODIUM PHOSPHATE 10 MG/ML IJ SOLN
INTRAMUSCULAR | Status: AC
  Filled 2021-01-29: qty 1

## 2021-01-29 MED ORDER — DEXAMETHASONE SODIUM PHOSPHATE 4 MG/ML IJ SOLN
INTRAMUSCULAR | Status: DC | PRN
  Administered 2021-01-29: 6 mg via INTRAVENOUS

## 2021-01-29 MED ORDER — METHOCARBAMOL 500 MG PO TABS: 500.0000 mg | ORAL_TABLET | Freq: Four times a day (QID) | ORAL | 0 refills | Status: DC

## 2021-01-29 SURGICAL SUPPLY — 82 items
ALCOHOL 70% 16 OZ (MISCELLANEOUS) ×3 IMPLANT
ANCHOR FBRTK 2.6 SUTURETAP 1.3 (Anchor) ×6 IMPLANT
ANCHOR SUT 1.8 FIBERTAK SB KL (Anchor) ×2 IMPLANT
ANCHOR SUT BIO SW 4.75X19.1 (Anchor) ×6 IMPLANT
ANCHOR SUT BIOCOMP CORKSREW (Anchor) ×2 IMPLANT
BAG COUNTER SPONGE SURGICOUNT (BAG) ×3 IMPLANT
BAG SURGICOUNT SPONGE COUNTING (BAG) ×2
BLADE CUTTER GATOR 3.5 (BLADE) IMPLANT
BLADE EXCALIBUR 4.0MM X 13CM (MISCELLANEOUS)
BLADE EXCALIBUR 4.0X13 (MISCELLANEOUS) IMPLANT
BLADE SURG 11 STRL SS (BLADE) IMPLANT
BUR OVAL 6.0 (BURR) IMPLANT
CLOSURE WOUND 1/2 X4 (GAUZE/BANDAGES/DRESSINGS)
COVER SURGICAL LIGHT HANDLE (MISCELLANEOUS) ×3 IMPLANT
DRAPE INCISE IOBAN 66X45 STRL (DRAPES) ×6 IMPLANT
DRAPE STERI 35X30 U-POUCH (DRAPES) ×3 IMPLANT
DRAPE U-SHAPE 47X51 STRL (DRAPES) ×6 IMPLANT
DRSG TEGADERM 4X4.75 (GAUZE/BANDAGES/DRESSINGS) ×11 IMPLANT
DURAPREP 26ML APPLICATOR (WOUND CARE) ×3 IMPLANT
DW OUTFLOW CASSETTE/TUBE SET (MISCELLANEOUS) ×2 IMPLANT
ELECT REM PT RETURN 9FT ADLT (ELECTROSURGICAL) ×3
ELECTRODE REM PT RTRN 9FT ADLT (ELECTROSURGICAL) ×1 IMPLANT
FILTER STRAW FLUID ASPIR (MISCELLANEOUS) ×3 IMPLANT
GAUZE SPONGE 4X4 12PLY STRL (GAUZE/BANDAGES/DRESSINGS) ×3 IMPLANT
GAUZE XEROFORM 1X8 LF (GAUZE/BANDAGES/DRESSINGS) ×3 IMPLANT
GLOVE SRG 8 PF TXTR STRL LF DI (GLOVE) ×1 IMPLANT
GLOVE SURG LTX SZ7 (GLOVE) ×3 IMPLANT
GLOVE SURG LTX SZ8 (GLOVE) ×3 IMPLANT
GLOVE SURG UNDER POLY LF SZ7 (GLOVE) ×3 IMPLANT
GLOVE SURG UNDER POLY LF SZ8 (GLOVE) ×2
GOWN STRL REUS W/ TWL LRG LVL3 (GOWN DISPOSABLE) ×3 IMPLANT
GOWN STRL REUS W/TWL LRG LVL3 (GOWN DISPOSABLE) ×6
HYDROGEN PEROXIDE 16OZ (MISCELLANEOUS) ×1 IMPLANT
KIT ANCHOR FBRTK 2.6 STR (KITS) ×2 IMPLANT
KIT BASIN OR (CUSTOM PROCEDURE TRAY) ×3 IMPLANT
KIT STR SPEAR 1.8 FBRTK DISP (KITS) IMPLANT
KIT TURNOVER KIT B (KITS) ×3 IMPLANT
MANIFOLD NEPTUNE II (INSTRUMENTS) ×3 IMPLANT
NDL HYPO 25X1 1.5 SAFETY (NEEDLE) ×1 IMPLANT
NDL SCORPION MULTI FIRE (NEEDLE) IMPLANT
NDL SPNL 18GX3.5 QUINCKE PK (NEEDLE) ×1 IMPLANT
NDL SUT 6 .5 CRC .975X.05 MAYO (NEEDLE) ×1 IMPLANT
NEEDLE HYPO 25X1 1.5 SAFETY (NEEDLE) ×3 IMPLANT
NEEDLE MAYO TAPER (NEEDLE) ×2
NEEDLE SCORPION MULTI FIRE (NEEDLE) ×3 IMPLANT
NEEDLE SPNL 18GX3.5 QUINCKE PK (NEEDLE) ×3 IMPLANT
NS IRRIG 1000ML POUR BTL (IV SOLUTION) ×3 IMPLANT
PACK SHOULDER (CUSTOM PROCEDURE TRAY) ×3 IMPLANT
PAD ARMBOARD 7.5X6 YLW CONV (MISCELLANEOUS) ×6 IMPLANT
PORT APPOLLO RF 90DEGREE MULTI (SURGICAL WAND) ×2 IMPLANT
RESTRAINT HEAD UNIVERSAL NS (MISCELLANEOUS) ×3 IMPLANT
SLING ARM IMMOBILIZER LRG (SOFTGOODS) ×2 IMPLANT
SOL PREP POV-IOD 4OZ 10% (MISCELLANEOUS) ×6 IMPLANT
SPONGE T-LAP 4X18 ~~LOC~~+RFID (SPONGE) ×3 IMPLANT
STRIP CLOSURE SKIN 1/2X4 (GAUZE/BANDAGES/DRESSINGS) ×1 IMPLANT
SUCTION FRAZIER HANDLE 10FR (MISCELLANEOUS) ×2
SUCTION TUBE FRAZIER 10FR DISP (MISCELLANEOUS) ×1 IMPLANT
SUT 2 FIBERLOOP 20 STRT BLUE (SUTURE)
SUT ETHILON 3 0 PS 1 (SUTURE) ×6 IMPLANT
SUT FIBERWIRE #2 38 T-5 BLUE (SUTURE)
SUT FIBERWIRE 2-0 18 17.9 3/8 (SUTURE) ×12
SUT MNCRL AB 3-0 PS2 18 (SUTURE) ×3 IMPLANT
SUT MNCRL AB 3-0 PS2 27 (SUTURE) ×2 IMPLANT
SUT VIC AB 0 CT1 27 (SUTURE) ×6
SUT VIC AB 0 CT1 27XBRD ANBCTR (SUTURE) ×2 IMPLANT
SUT VIC AB 1 CT1 27 (SUTURE) ×6
SUT VIC AB 1 CT1 27XBRD ANBCTR (SUTURE) ×1 IMPLANT
SUT VIC AB 2-0 CT1 27 (SUTURE) ×2
SUT VIC AB 2-0 CT1 TAPERPNT 27 (SUTURE) ×1 IMPLANT
SUT VICRYL 0 UR6 27IN ABS (SUTURE) ×11 IMPLANT
SUTURE 2 FIBERLOOP 20 STRT BLU (SUTURE) IMPLANT
SUTURE FIBERWR #2 38 T-5 BLUE (SUTURE) IMPLANT
SUTURE FIBERWR 2-0 18 17.9 3/8 (SUTURE) IMPLANT
SYR 20ML LL LF (SYRINGE) ×2 IMPLANT
SYR 30ML LL (SYRINGE) ×3 IMPLANT
SYR TB 1ML LUER SLIP (SYRINGE) ×1 IMPLANT
SYS FBRTK BUTTON 2.6 (Anchor) ×3 IMPLANT
SYSTEM FBRTK BUTTON 2.6 (Anchor) IMPLANT
TOWEL GREEN STERILE (TOWEL DISPOSABLE) ×3 IMPLANT
TOWEL GREEN STERILE FF (TOWEL DISPOSABLE) ×3 IMPLANT
TUBING ARTHROSCOPY IRRIG 16FT (MISCELLANEOUS) ×3 IMPLANT
WATER STERILE IRR 1000ML POUR (IV SOLUTION) ×3 IMPLANT

## 2021-01-29 NOTE — Anesthesia Procedure Notes (Signed)
Procedure Name: Intubation Date/Time: 01/29/2021 7:51 AM Performed by: Lieutenant Diego, CRNA Pre-anesthesia Checklist: Patient identified, Emergency Drugs available, Suction available and Patient being monitored Patient Re-evaluated:Patient Re-evaluated prior to induction Oxygen Delivery Method: Circle system utilized Preoxygenation: Pre-oxygenation with 100% oxygen Induction Type: IV induction Ventilation: Mask ventilation without difficulty Laryngoscope Size: Miller and 2 Grade View: Grade II Tube type: Oral Tube size: 7.0 mm Number of attempts: 2 Airway Equipment and Method: Stylet Placement Confirmation: ETT inserted through vocal cords under direct vision, positive ETCO2 and breath sounds checked- equal and bilateral Secured at: 22 cm Tube secured with: Tape Dental Injury: Teeth and Oropharynx as per pre-operative assessment

## 2021-01-29 NOTE — Anesthesia Postprocedure Evaluation (Signed)
Anesthesia Post Note  Patient: Calandra Madura  Procedure(s) Performed: RIGHT SHOULDER ARTHROSCOPY, DEBRIDEMENT, WITH MINI OPEN ROTATOR CUFF TEAR REPAIR & BICEPS TENODESIS (Right)     Patient location during evaluation: PACU Anesthesia Type: Regional and General Level of consciousness: awake and alert Pain management: pain level controlled Vital Signs Assessment: post-procedure vital signs reviewed and stable Respiratory status: spontaneous breathing, nonlabored ventilation, respiratory function stable and patient connected to nasal cannula oxygen Cardiovascular status: blood pressure returned to baseline and stable Postop Assessment: no apparent nausea or vomiting Anesthetic complications: no   No notable events documented.  Last Vitals:  Vitals:   01/29/21 1136 01/29/21 1151  BP: 128/61 (!) 125/58  Pulse: 65 64  Resp: 14 13  Temp:  (!) 36.4 C  SpO2: 95% 96%    Last Pain:  Vitals:   01/29/21 0545  TempSrc: Oral                 Barnet Glasgow

## 2021-01-29 NOTE — Op Note (Signed)
NAMECLAUDIA, [name] MEDICAL RECORD NO: [id number] ACCOUNT NO: [id number] DATE OF BIRTH: 1950-11-23 FACILITY: [facility] LOCATION: [facility] PHYSICIAN: Yetta Barre. Marlou Sa, MD  Operative Report   DATE OF PROCEDURE: 01/29/2021  PREOPERATIVE DIAGNOSES:  Right shoulder biceps subluxation supraspinatus tear and subscapularis tear.  POSTOPERATIVE DIAGNOSES:  Right shoulder biceps subluxation supraspinatus tear and subscapularis tear.  PROCEDURE PERFORMED:  Right shoulder arthroscopy with intra-articular debridement, release of the biceps tendon and mini open biceps tenodesis and subscap repair and supraspinatus tendon repair.  SURGEON:  Yetta Barre. Marlou Sa, MD  ASSISTANT:  Alena Bills, MS3.  INDICATIONS:  The patient is a 70 year old patient with right shoulder pain, who presents for operative management.  After explanation of risks and benefits.  DESCRIPTION OF PROCEDURE:  The patient was brought to the operating room where general endotracheal anesthesia was induced.  Preoperative antibiotics were administered.  Timeout was called.  The patient was placed in the beach chair position with the  head in neutral position, right arm, shoulder and hand prescrubbed with alcohol, Betadine, and allowed to air dry, prepped with DuraPrep solution and draped in sterile manner.  Ioban used to cover the operative field, sealing it and covering the axilla.   Shoulder was examined under anesthesia and found to be stable anteriorly and posteriorly with less than a centimeter sulcus sign with no restriction of external rotation, a 15 degrees of abduction.  Passive range of motion was 65/95/175.  After sterile  prepping and draping, timeout was called.  Posterior portal was created 2 cm medial and inferior to the posterolateral margin of the acromion.  Diagnostic arthroscopy was performed.  The patient had slight amount of grade II chondromalacia over an 8 x 8  mm area on the humeral head.  Glenoid was intact.   Anterior inferior, posterior inferior glenohumeral ligaments intact.  The patient had a degenerative SLAP tear as well as medial subluxation of the biceps tendon.  Anterior portal created under direct  visualization.  Arthrocare wand used to debride the superior labrum, released the biceps tendon.  Supraspinatus tear was also identified and the intraarticular subscap was torn, but not too severely retracted.  The supraspinatus tear measured about 2 x 2  cm.  Instruments were removed.  Portals were closed using 3-0 nylon.  Ioban used to cover the entire operative field.  Next, an incision made off the anterolateral margin of the acromion, marked with a #1 Vicryl suture at 4 cm between the anterior  middle raphae.  Next, bursectomy performed.  Biceps tendon was then identified and tenodesed into the bicipital groove using a knotless suture anchor distally 1.9 mm and a 3.2 mm knotless suture anchor with 2 passing mechanisms proximally oversewn with a  Vicryl suture x2.  Gweneth Dimitri was identified at this time and found to have some of its attachment remaining intact, but still there was some retraction intraarticularly proximally.  A knotless suture anchor with two tapes was placed and those 4 limbs were  placed through the subscap tendon and then tied and the free ends were then mobilized back across the bicipital groove.  Next, the supraspinatus was identified.  The U-shaped tear measuring about 2 x 2 cm was encountered.  This tear was then debrided of  devitalized appearing rotator cuff tissue and the footprint was prepared to bleeding bony surface.  Margin convergence sutures placed in inverted fashion x4 2-0 FiberWire sutures were placed.  Grasping sutures were placed x4 of a 0 Vicryl suture.  Next,  two suture  tape anchors were placed with the 8 limbs placed equidistant in the supraspinatus tendon.  With the converging margins closed and tied the suture tapes were tied and crossed.  Next, these suture tapes  were split and loaded in SwiveLock  anchors and with the arm in abduction a watertight repair was achieved.  Bone quality was poor, but suture anchor fixation was excellent due to under preparing the hole.  Next, the subscapularis sutures were brought across the construct into bone around  the slightly posterior metaphyseal region.  Fixation was achieved and that was also very good fixation.  A subacromial decompression with acromioplasty was then performed, but with preservation of the CA ligament.  Thorough irrigation performed.  Deltoid  split closed using #1 Vicryl suture followed by interrupted inverted 0 Vicryl suture, 2-0 Vicryl suture, and 3-0 Monocryl with Steri-Strips and impervious dressings and a shoulder immobilizer applied.   PUS D: 01/29/2021 11:21:43 am T: 01/29/2021 11:46:00 am  JOB: [id number]/ [id number]

## 2021-01-29 NOTE — Anesthesia Procedure Notes (Signed)
Anesthesia Regional Block: Interscalene brachial plexus block   Pre-Anesthetic Checklist: , timeout performed,  Correct Patient, Correct Site, Correct Laterality,  Correct Procedure, Correct Position, site marked,  Risks and benefits discussed,  Surgical consent,  Pre-op evaluation,  At surgeon's request and post-op pain management  Laterality: Upper and Right  Prep: Maximum Sterile Barrier Precautions used, chloraprep       Needles:  Injection technique: Single-shot  Needle Type: Echogenic Needle     Needle Length: 5cm  Needle Gauge: 21     Additional Needles:   Procedures:,,,, ultrasound used (permanent image in chart),,    Narrative:  Start time: 01/29/2021 7:12 AM End time: 01/29/2021 7:23 AM Injection made incrementally with aspirations every 5 mL.  Performed by: Personally  Anesthesiologist: Barnet Glasgow, MD  Additional Notes: Block assessed prior to procedure. Patient tolerated procedure well.

## 2021-01-29 NOTE — Transfer of Care (Signed)
Immediate Anesthesia Transfer of Care Note  Patient: Vanessa [name]  Procedure(s) Performed: RIGHT SHOULDER ARTHROSCOPY, DEBRIDEMENT, WITH MINI OPEN ROTATOR CUFF TEAR REPAIR & BICEPS TENODESIS (Right)  Patient Location: PACU  Anesthesia Type:General  Level of Consciousness: awake, alert  and oriented  Airway & Oxygen Therapy: Patient Spontanous Breathing and Patient connected to nasal cannula oxygen  Post-op Assessment: Report given to RN and Post -op Vital signs reviewed and stable  Post vital signs: Reviewed and stable  Last Vitals:  Vitals Value Taken Time  BP 144/68 01/29/21 1121  Temp    Pulse 77 01/29/21 1122  Resp 18 01/29/21 1122  SpO2 100 % 01/29/21 1122  Vitals shown include unvalidated device data.  Last Pain:  Vitals:   01/29/21 0545  TempSrc: Oral         Complications: No notable events documented.

## 2021-01-29 NOTE — Brief Op Note (Signed)
° °  01/29/2021  11:15 AM  PATIENT:  Vanessa [name]  70 y.o. female  PRE-OPERATIVE DIAGNOSIS:  right shoulder rotator cuff tear, biceps subluxation  POST-OPERATIVE DIAGNOSIS:  right shoulder rotator cuff tear, biceps subluxation  PROCEDURE:  Procedure(s): RIGHT SHOULDER ARTHROSCOPY, DEBRIDEMENT, WITH MINI OPEN ROTATOR CUFF TEAR REPAIR & BICEPS TENODESIS  SURGEON:  Surgeon(s): Meredith Pel, MD  ASSISTANT: Glennon Mac [place] ms3  ANESTHESIA:   general  EBL: 15 ml    Total I/O In: 1100 [I.V.:1000; IV Piggyback:100] Out: 50 [Blood:50]  BLOOD ADMINISTERED: none  DRAINS: none   LOCAL MEDICATIONS USED:  none  SPECIMEN:  No Specimen  COUNTS:  YES  TOURNIQUET:  * No tourniquets in log *  DICTATION: .Other Dictation: Dictation Number [id number]  PLAN OF CARE: Discharge to home after PACU  PATIENT DISPOSITION:  PACU - hemodynamically stable

## 2021-01-29 NOTE — H&P (Signed)
Vanessa [name] is an 70 y.o. female.   Chief Complaint: right shoulder pain HPI: Vanessa [name] is a patient with right shoulder pain.  Since she was last seen she has had an MRI scan of the right shoulder which demonstrates subscapularis torn and retracted up to 3 cm.  Severe fatty atrophy of the subscapularis is present.  She also has full-thickness retracted tear of the supraspinatus with maximal retraction around a centimeter and a half.  Biceps tendon is dislocated medially.  Moderate effusion is present.  Patient reports he likes to golf walk and do exercise classes and Pilates.  Pain is a more significant component of her symptomatology than weakness.  She cannot take anti-inflammatories.  She does take aspirin and Tylenol for her symptoms.  Past Medical History:  Diagnosis Date   Aortic valve sclerosis    Arthritis    osteoarthritis   Cancer ([place])    endometrial   Chronic kidney disease    stage 3   Depression    GERD (gastroesophageal reflux disease)    Heart murmur    AV sclerosis; 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 but no obvious stenosis or regurgitation 12/04/17 echo ([place])   Hepatitis    History of kidney stones    Hypertension    Hypothyroidism    PONV (postoperative nausea and vomiting)     Past Surgical History:  Procedure Laterality Date   ABDOMINAL HYSTERECTOMY  2008   BREAST SURGERY Right 06/20/2011   lumpectomy for "atypical ductal hyperplasia"   TONSILLECTOMY     TUBAL LIGATION      History reviewed. No pertinent family history. Social History:  reports that she has quit smoking. Her smoking use included cigarettes. She has never used smokeless tobacco. She reports current alcohol use. She reports that she does not use drugs.  Allergies:  Allergies  Allergen Reactions   Codeine     Upset stomach   Lisinopril Cough   Morphine     GI Upset (intolerance)    Medications Prior to Admission  Medication Sig Dispense Refill   acetaminophen (TYLENOL)  650 MG CR tablet Take 650 mg by mouth 2 (two) times daily as needed for pain.     aspirin EC 325 MG tablet Take 325 mg by mouth daily as needed for moderate pain.     atorvastatin (LIPITOR) 40 MG tablet Take 40 mg by mouth daily.     Cyanocobalamin (B-12 PO) Take 1 capsule by mouth 3 (three) times a week.     diltiazem (CARDIZEM CD) 360 MG 24 hr capsule Take 360 mg by mouth daily.     famotidine (PEPCID) 20 MG tablet Take 20 mg by mouth daily.     furosemide (LASIX) 20 MG tablet Take 20 mg by mouth daily as needed for edema.     hydrALAZINE (APRESOLINE) 100 MG tablet Take 100 mg by mouth 3 (three) times daily.     levothyroxine (SYNTHROID) 150 MCG tablet TAKE 1 TABLET EVERY DAY  AT  6AM     loratadine (CLARITIN) 10 MG tablet Take 10 mg by mouth daily as needed for allergies.     mirtazapine (REMERON) 15 MG tablet Take 15 mg by mouth at bedtime.     Multiple Vitamins-Minerals (HAIR SKIN AND NAILS FORMULA) TABS Take 1-2 tablets by mouth See admin instructions. Take 1 tablet on days when taking the b12 and 2 tablets on the non-b12 days     ondansetron (ZOFRAN-ODT) 8 MG disintegrating tablet Take 8 mg by  mouth 2 (two) times daily as needed for nausea/vomiting.     scopolamine (TRANSDERM-SCOP) 1 MG/3DAYS Place 1 patch onto the skin 2 (two) times a week.     sodium bicarbonate 650 MG tablet Take 650 mg by mouth 2 (two) times daily.     venlafaxine XR (EFFEXOR-XR) 37.5 MG 24 hr capsule Take 37.5 mg by mouth daily.     VITAMIN D PO Take 1 capsule by mouth 3 (three) times a week.      No results found for this or any previous visit (from the past 48 hour(s)). No results found.  Review of Systems  Musculoskeletal:  Positive for arthralgias.  All other systems reviewed and are negative.  Blood pressure (!) 149/58, pulse 79, temperature 98.3 F (36.8 C), temperature source Oral, resp. rate 18, height 5\' 5"  (1.651 m), weight 77.1 kg, SpO2 94 %. Physical Exam Vitals reviewed.  HENT:     Head:  Normocephalic.     Nose: Nose normal.     Mouth/Throat:     Mouth: Mucous membranes are moist.  Eyes:     Pupils: Pupils are equal, round, and reactive to light.  Cardiovascular:     Rate and Rhythm: Normal rate.     Pulses: Normal pulses.  Pulmonary:     Effort: Pulmonary effort is normal.  Abdominal:     General: Abdomen is flat.  Musculoskeletal:     Cervical back: Normal range of motion.  Skin:    General: Skin is warm.     Capillary Refill: Capillary refill takes less than 2 seconds.  Neurological:     General: No focal deficit present.     Mental Status: She is alert.  Psychiatric:        Mood and Affect: Mood normal.   Ortho exam demonstrates good cervical spine range of motion.  Motor or sensory function to the hand is intact.  Passive range of motion on the right is 85/90/170.  She does have slight weakness to the subscap testing but not as much as 1 would expect based on her findings.  She has good infraspinatus strength and 4 out of 5 supraspinatus strength.  No Popeye deformity is present.  No AC joint tenderness is present  Assessment/Plan Impression is right shoulder rotator cuff tear anterior superior pathology.  Biceps tendon dislocated medially.  Plan arthroscopy with debridement and release of the biceps tendon with mini open rotator cuff tear repair of the supraspinatus.  Subscapularis is retracted and unlikely to be able to be mobilized to the point to achieve repair.  Patient understands the risk and benefits.  I think if we can get a watertight/operative repair of the supraspinatus then she would have reasonable but not perfect function.  The risk and benefits of the procedure are discussed including not limited to infection nerve vessel damage shoulder stiffness as well as possibility of further surgery being required.  All questions answered.    Vanessa Malta, MD 01/29/2021, 6:49 AM

## 2021-02-01 ENCOUNTER — Encounter (HOSPITAL_COMMUNITY): Payer: Self-pay | Admitting: Orthopedic Surgery

## 2021-02-01 DIAGNOSIS — M7521 Bicipital tendinitis, right shoulder: Secondary | ICD-10-CM

## 2021-02-01 DIAGNOSIS — M75121 Complete rotator cuff tear or rupture of right shoulder, not specified as traumatic: Secondary | ICD-10-CM

## 2021-02-01 DIAGNOSIS — S43431A Superior glenoid labrum lesion of right shoulder, initial encounter: Secondary | ICD-10-CM

## 2021-02-03 ENCOUNTER — Encounter (HOSPITAL_COMMUNITY): Payer: Self-pay | Admitting: Orthopedic Surgery

## 2021-02-10 ENCOUNTER — Encounter: Payer: Self-pay | Admitting: Orthopedic Surgery

## 2021-02-10 ENCOUNTER — Ambulatory Visit (INDEPENDENT_AMBULATORY_CARE_PROVIDER_SITE_OTHER): Payer: Medicare Other | Admitting: Orthopedic Surgery

## 2021-02-10 ENCOUNTER — Other Ambulatory Visit: Payer: Self-pay

## 2021-02-10 DIAGNOSIS — M75121 Complete rotator cuff tear or rupture of right shoulder, not specified as traumatic: Secondary | ICD-10-CM

## 2021-02-10 NOTE — Progress Notes (Signed)
° °  Post-Op Visit Note   Patient: Vanessa [name]           Date of Birth: Apr 03, 1950           MRN: [id number] Visit Date: 02/10/2021 PCP: Berdie Ogren, MD   Assessment & Plan:  Chief Complaint:  Chief Complaint  Patient presents with   Right Shoulder - Routine Post Op   Visit Diagnoses:  1. Complete tear of right rotator cuff, unspecified whether traumatic     Plan: Vanessa [name] is a 71 year old patient who is now about 10 days out right shoulder rotator cuff tear of supraspinatus biceps tenodesis as well as upper subscap repair.  This was a large tear.  Pictures were reviewed.  Vanessa [name] is doing well with her passive range of motion up to 90 degrees on her accordion CPM.  On exam the incision is intact.  Passive range of motion is smooth.  Has excellent rotator cuff strength.  Has about 45 degrees of external rotation and 90 of abduction at this time.  Overall the shoulder looks excellent in terms of passive range of motion.  Continue with sling for 2 more weeks continue with CPM machine 1 hour 3 times a day.  No lifting with the right arm.  DC sling in 2 weeks and we will likely initiate some physical therapy at that time for isometric strengthening.  Follow-Up Instructions: Return in about 2 weeks (around 02/24/2021).   Orders:  No orders of the defined types were placed in this encounter.  No orders of the defined types were placed in this encounter.   Imaging: No results found.  PMFS History: Patient Active Problem List   Diagnosis Date Noted   Complete tear of right rotator cuff    Biceps tendonitis on right    Degenerative superior labral anterior-to-posterior (SLAP) tear of right shoulder    Unilateral primary osteoarthritis, right knee 05/13/2020   Past Medical History:  Diagnosis Date   Aortic valve sclerosis    Arthritis    osteoarthritis   Cancer ([place])    endometrial   Chronic kidney disease    stage 3   Depression    GERD (gastroesophageal reflux disease)     Heart murmur    AV sclerosis; 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 but no obvious stenosis or regurgitation 12/04/17 echo ([place])   Hepatitis    History of kidney stones    Hypertension    Hypothyroidism    PONV (postoperative nausea and vomiting)     History reviewed. No pertinent family history.  Past Surgical History:  Procedure Laterality Date   ABDOMINAL HYSTERECTOMY  2008   BREAST SURGERY Right 06/20/2011   lumpectomy for "atypical ductal hyperplasia"   SHOULDER ARTHROSCOPY WITH ROTATOR CUFF REPAIR AND SUBACROMIAL DECOMPRESSION Right 01/29/2021   Procedure: RIGHT SHOULDER ARTHROSCOPY, DEBRIDEMENT, WITH MINI OPEN ROTATOR CUFF TEAR REPAIR & BICEPS TENODESIS;  Surgeon: Meredith Pel, MD;  Location: [facility];  Service: Orthopedics;  Laterality: Right;   TONSILLECTOMY     TUBAL LIGATION     Social History   Occupational History   Not on file  Tobacco Use   Smoking status: Former    Types: Cigarettes   Smokeless tobacco: Never  Vaping Use   Vaping Use: Never used  Substance and Sexual Activity   Alcohol use: Yes    Comment: 4-5 times week - 1 drink   Drug use: Never   Sexual activity: Not on file

## 2021-02-25 ENCOUNTER — Other Ambulatory Visit: Payer: Self-pay

## 2021-02-25 ENCOUNTER — Ambulatory Visit (INDEPENDENT_AMBULATORY_CARE_PROVIDER_SITE_OTHER): Payer: Medicare Other | Admitting: Orthopedic Surgery

## 2021-02-25 DIAGNOSIS — M75121 Complete rotator cuff tear or rupture of right shoulder, not specified as traumatic: Secondary | ICD-10-CM

## 2021-02-28 ENCOUNTER — Encounter: Payer: Self-pay | Admitting: Orthopedic Surgery

## 2021-02-28 NOTE — Progress Notes (Signed)
Office Visit Note   Patient: Vanessa [name]           Date of Birth: [birth year]           MRN: [id number] Visit Date: 02/25/2021 Requested by: Berdie Ogren, [place],  [place] [address] PCP: Hix, Jonna Munro, MD  Subjective: Chief Complaint  Patient presents with   Right Shoulder - Routine Post Op    01/29/21-Right shoulder arthroscopy with intra-articular debridement, release of the biceps tendon and mini open biceps tenodesis and subscap repair and supraspinatus tendon repair.    HPI: Cyani is a 71 year old patient who is now about a month out right shoulder arthroscopy with rotator cuff repair of supraspinatus and the anterior part of the infraspinatus along with upper subscap.  Biceps tenodesis also done.  She has been at the beach.  CPM brace at 90 degrees.  On exam she has excellent passive range of motion with only minimal crepitus anteriorly with internal and external rotation at 90 degrees of abduction.  Rotator cuff strength is excellent including subscap strength at 5 out of 5 in infraspinatus supraspinatus strength at 5- out of 5.  At this time I want to discontinue the sling at the end of January.  Bone quality was marginal for anchor retention.  Start physical therapy next week for 4 weeks with passive range of motion only with isometrics for the first 6 weeks after surgery date progressing to strengthening and functional overhead activity in the second 6 weeks after surgery.  Op note provided.  Come back in 4 weeks for clinical recheck.              ROS: See above  Assessment & Plan: Visit Diagnoses: No diagnosis found.  Plan: See above  Follow-Up Instructions: Return in about 4 weeks (around 03/25/2021).   Orders:  No orders of the defined types were placed in this encounter.  No orders of the defined types were placed in this encounter.     Procedures: No procedures performed   Clinical Data: No additional  findings.  Objective: Vital Signs: There were no vitals taken for this visit.  Physical Exam: See above  Ortho Exam: See above  Specialty Comments:  No specialty comments available.  Imaging: No results found.   PMFS History: Patient Active Problem List   Diagnosis Date Noted   Complete tear of right rotator cuff    Biceps tendonitis on right    Degenerative superior labral anterior-to-posterior (SLAP) tear of right shoulder    Unilateral primary osteoarthritis, right knee 05/13/2020   Past Medical History:  Diagnosis Date   Aortic valve sclerosis    Arthritis    osteoarthritis   Cancer ([facility])    endometrial   Chronic kidney disease    stage 3   Depression    GERD (gastroesophageal reflux disease)    Heart murmur    AV sclerosis; 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 but no obvious stenosis or regurgitation 12/04/17 echo ([facility])   Hepatitis    History of kidney stones    Hypertension    Hypothyroidism    PONV (postoperative nausea and vomiting)     History reviewed. No pertinent family history.  Past Surgical History:  Procedure Laterality Date   ABDOMINAL HYSTERECTOMY  2008   BREAST SURGERY Right 06/20/2011   lumpectomy for "atypical ductal hyperplasia"   SHOULDER ARTHROSCOPY WITH ROTATOR CUFF REPAIR AND SUBACROMIAL DECOMPRESSION Right 01/29/2021   Procedure: RIGHT SHOULDER ARTHROSCOPY, DEBRIDEMENT, WITH  MINI OPEN ROTATOR CUFF TEAR REPAIR & BICEPS TENODESIS;  Surgeon: Meredith Pel, MD;  Location: [facility];  Service: Orthopedics;  Laterality: Right;   TONSILLECTOMY     TUBAL LIGATION     Social History   Occupational History   Not on file  Tobacco Use   Smoking status: Former    Types: Cigarettes   Smokeless tobacco: Never  Vaping Use   Vaping Use: Never used  Substance and Sexual Activity   Alcohol use: Yes    Comment: 4-5 times week - 1 drink   Drug use: Never   Sexual activity: Not on file

## 2021-03-03 ENCOUNTER — Other Ambulatory Visit: Payer: Self-pay | Admitting: Orthopedic Surgery

## 2021-03-03 DIAGNOSIS — M75121 Complete rotator cuff tear or rupture of right shoulder, not specified as traumatic: Secondary | ICD-10-CM

## 2021-03-25 ENCOUNTER — Encounter: Payer: Self-pay | Admitting: Orthopedic Surgery

## 2021-03-25 ENCOUNTER — Other Ambulatory Visit: Payer: Self-pay

## 2021-03-25 ENCOUNTER — Ambulatory Visit (INDEPENDENT_AMBULATORY_CARE_PROVIDER_SITE_OTHER): Payer: Medicare Other | Admitting: Orthopedic Surgery

## 2021-03-25 DIAGNOSIS — M75121 Complete rotator cuff tear or rupture of right shoulder, not specified as traumatic: Secondary | ICD-10-CM

## 2021-03-25 NOTE — Progress Notes (Signed)
Post-Op Visit Note   Patient: Vanessa [name]           Date of Birth: [birth year]           MRN: [id number] Visit Date: 03/25/2021 PCP: Berdie Ogren, MD   Assessment & Plan:  Chief Complaint:  Chief Complaint  Patient presents with   Right Shoulder - Follow-up     01/29/21-Right shoulder arthroscopy with intra-articular debridement, release of the biceps tendon and mini open biceps tenodesis and subscap repair and supraspinatus tendon repair.      Visit Diagnoses:  1. Complete tear of right rotator cuff, unspecified whether traumatic     Plan: [name] is a 71 year old patient right shoulder arthroscopy biceps tenodesis and rotator cuff tear repair of the supraspinatus and upper subscap.  She has been doing well.  Going to therapy.  Doing early strengthening and stretching.  Her functional ADLs are intact.  Her partner is undergoing breast cancer treatment.  She would like to start playing some golf sometime during the spring.  On examination she has forward flexion and abduction easily above 90 degrees.  External rotation passively is to about 60.  Strength is excellent to subscap testing as well as infraspinatus testing.  No coarseness with internal and external rotation of the arm both at 15 degrees and 90 degrees.  Plan is okay to start chipping only in 2 weeks.  6-week return for clinical recheck and decision for or against progression of golf swing.  I do want her to be careful with strengthening and not to do too much with the shoulder that would disrupt the integrity of the repair.  Follow-Up Instructions: Return in about 6 weeks (around 05/06/2021).   Orders:  No orders of the defined types were placed in this encounter.  No orders of the defined types were placed in this encounter.   Imaging: No results found.  PMFS History: Patient Active Problem List   Diagnosis Date Noted   Complete tear of right rotator cuff    Biceps tendonitis on right     Degenerative superior labral anterior-to-posterior (SLAP) tear of right shoulder    Unilateral primary osteoarthritis, right knee 05/13/2020   Past Medical History:  Diagnosis Date   Aortic valve sclerosis    Arthritis    osteoarthritis   Cancer ([facility])    endometrial   Chronic kidney disease    stage 3   Depression    GERD (gastroesophageal reflux disease)    Heart murmur    AV sclerosis; 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 but no obvious stenosis or regurgitation 12/04/17 echo ([facility])   Hepatitis    History of kidney stones    Hypertension    Hypothyroidism    PONV (postoperative nausea and vomiting)     History reviewed. No pertinent family history.  Past Surgical History:  Procedure Laterality Date   ABDOMINAL HYSTERECTOMY  2008   BREAST SURGERY Right 06/20/2011   lumpectomy for "atypical ductal hyperplasia"   SHOULDER ARTHROSCOPY WITH ROTATOR CUFF REPAIR AND SUBACROMIAL DECOMPRESSION Right 01/29/2021   Procedure: RIGHT SHOULDER ARTHROSCOPY, DEBRIDEMENT, WITH MINI OPEN ROTATOR CUFF TEAR REPAIR & BICEPS TENODESIS;  Surgeon: Meredith Pel, MD;  Location: [facility];  Service: Orthopedics;  Laterality: Right;   TONSILLECTOMY     TUBAL LIGATION     Social History   Occupational History   Not on file  Tobacco Use   Smoking status: Former    Types: Cigarettes   Smokeless tobacco: Never  Vaping Use   Vaping Use: Never used  Substance and Sexual Activity   Alcohol use: Yes    Comment: 4-5 times week - 1 drink   Drug use: Never   Sexual activity: Not on file

## 2021-05-03 ENCOUNTER — Other Ambulatory Visit: Payer: Self-pay

## 2021-05-03 ENCOUNTER — Ambulatory Visit (INDEPENDENT_AMBULATORY_CARE_PROVIDER_SITE_OTHER): Payer: Medicare Other | Admitting: Orthopedic Surgery

## 2021-05-03 DIAGNOSIS — M75121 Complete rotator cuff tear or rupture of right shoulder, not specified as traumatic: Secondary | ICD-10-CM

## 2021-05-05 ENCOUNTER — Encounter: Payer: Self-pay | Admitting: Orthopedic Surgery

## 2021-05-05 NOTE — Progress Notes (Signed)
? ?Post-Op Visit Note ?  ?Patient: Vanessa [name]           ?Date of Birth: [birth year]           ?MRN: [id number] ?Visit Date: 05/03/2021 ?PCP: Hix, Jonna Munro, MD ? ? ?Assessment & Plan: ? ?Chief Complaint:  ?Chief Complaint  ?Patient presents with  ? Right Shoulder - Follow-up  ?      01/29/21-Right shoulder arthroscopy with intra-articular debridement, release of the biceps tendon and mini open biceps tenodesis and subscap repair and supraspinatus tendon repair.  ? ?Visit Diagnoses:  ?1. Complete tear of right rotator cuff, unspecified whether traumatic   ? ? ?Plan: Vanessa [name] is a 71 year old patient who underwent right shoulder arthroscopy with rotator cuff tear repair biceps tenodesis.  This involved supraspinatus and upper portion of subscap.  Overall she is doing well with no complaints.  She finished physical therapy.  On exam she has good range of motion and very good strength.  No coarse grinding or crepitus with active or passive range of motion of that right shoulder.  Has not started playing golf yet.  We did discuss a trial approach to resuming golf.  I would like her to be somewhat careful with the shoulder for the next 6 months in terms of heavy lifting.  She will follow-up with [place] as needed.  Overall Vanessa [name] has done an excellent job of rehabilitating a challenging rotator cuff tear pattern and problem. ? ?Follow-Up Instructions: Return if symptoms worsen or fail to improve.  ? ?Orders:  ?No orders of the defined types were placed in this encounter. ? ?No orders of the defined types were placed in this encounter. ? ? ?Imaging: ?No results found. ? ?PMFS History: ?Patient Active Problem List  ? Diagnosis Date Noted  ? Complete tear of right rotator cuff   ? Biceps tendonitis on right   ? Degenerative superior labral anterior-to-posterior (SLAP) tear of right shoulder   ? Unilateral primary osteoarthritis, right knee 05/13/2020  ? ?Past Medical History:  ?Diagnosis Date  ? Aortic valve sclerosis   ?  Arthritis   ? osteoarthritis  ? [facility])   ? endometrial  ? Chronic kidney disease   ? stage 3  ? Depression   ? GERD (gastroesophageal reflux disease)   ? Heart murmur   ? AV sclerosis; 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 but no obvious stenosis or regurgitation 12/04/17 echo ([facility])  ? Hepatitis   ? History of kidney stones   ? Hypertension   ? Hypothyroidism   ? PONV (postoperative nausea and vomiting)   ?  ?History reviewed. No pertinent family history.  ?Past Surgical History:  ?Procedure Laterality Date  ? ABDOMINAL HYSTERECTOMY  2008  ? BREAST SURGERY Right 06/20/2011  ? lumpectomy for "atypical ductal hyperplasia"  ? SHOULDER ARTHROSCOPY WITH ROTATOR CUFF REPAIR AND SUBACROMIAL DECOMPRESSION Right 01/29/2021  ? Procedure: RIGHT SHOULDER ARTHROSCOPY, DEBRIDEMENT, WITH MINI OPEN ROTATOR CUFF TEAR REPAIR & BICEPS TENODESIS;  Surgeon: Meredith Pel, MD;  Location: [facility];  Service: Orthopedics;  Laterality: Right;  ? TONSILLECTOMY    ? TUBAL LIGATION    ? ?Social History  ? ?Occupational History  ? Not on file  ?Tobacco Use  ? Smoking status: Former  ?  Types: Cigarettes  ? Smokeless tobacco: Never  ?Vaping Use  ? Vaping Use: Never used  ?Substance and Sexual Activity  ? Alcohol use: Yes  ?  Comment: 4-5 times week - 1 drink  ? Drug use:  Never  ? Sexual activity: Not on file  ? ? ? ?

## 2021-05-06 ENCOUNTER — Ambulatory Visit: Payer: Medicare Other | Admitting: Orthopedic Surgery

## 2022-05-07 IMAGING — MR MR SHOULDER*R* W/O CM
5 series · 36 of 40 positions shown · non-contrast
Comparison: Radiographs 09/30/2020

CLINICAL DATA: Shoulder pain since May 2020.

EXAM:
MRI OF THE RIGHT SHOULDER WITHOUT CONTRAST
TECHNIQUE: Multiplanar, multisequence MR imaging of the shoulder was performed.
No intravenous contrast was administered.

[Series 3: T2 fat-sat · axial · 4.0mm · 0.59mm/px · z∈[-27,+84]mm · 8 of 25 slices shown (1 of 3)]
[im 1/25]
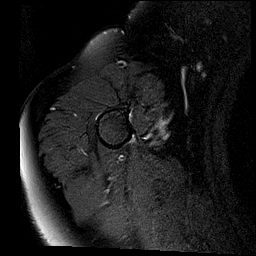
[im 4/25]
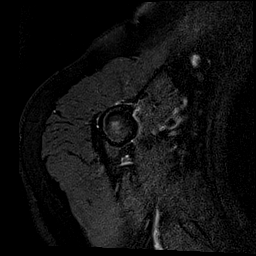
[im 7/25]
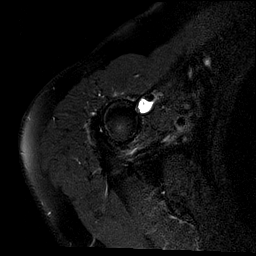
[im 11/25]
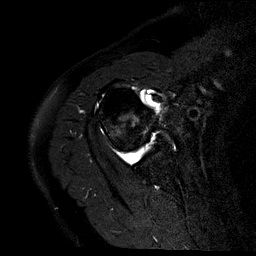
[im 14/25]
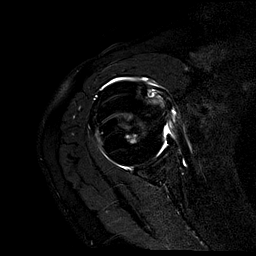
[im 18/25]
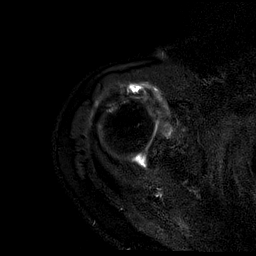
[im 21/25]
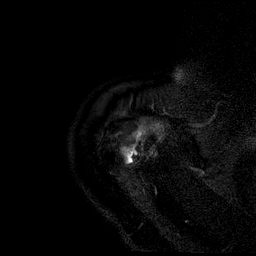
[im 25/25]
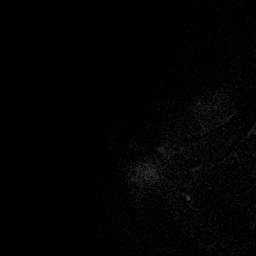

[Series 4: T2 fat-sat · oblique · 4.0mm · 0.59mm/px · 8 of 24 slices shown (2 of 3)]
[im 1/24]
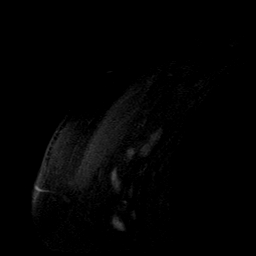
[im 4/24]
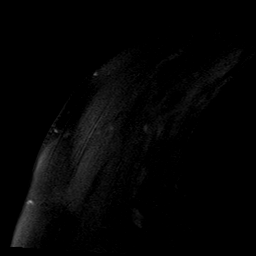
[im 7/24]
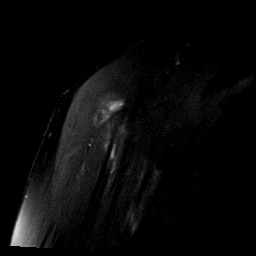
[im 10/24]
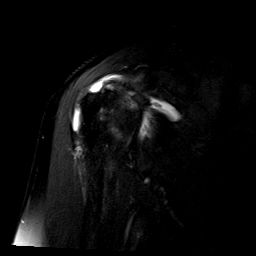
[im 14/24]
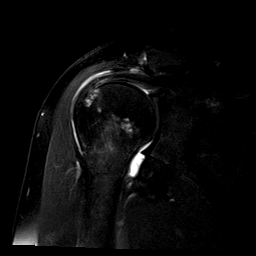
[im 17/24]
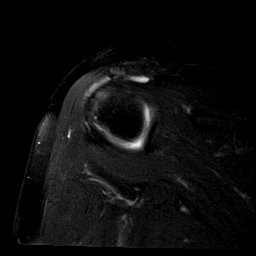
[im 20/24]
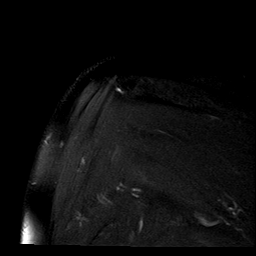
[im 24/24]
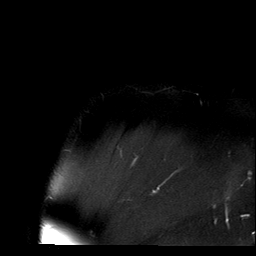

[Series 5: PD · oblique · 4.0mm · 0.29mm/px · 8 of 24 slices shown]
[im 1/24]
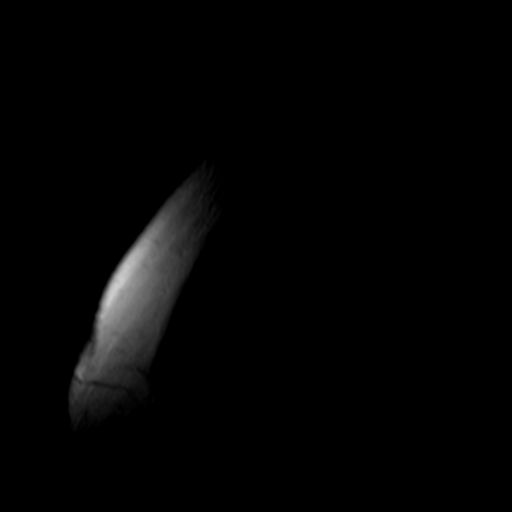
[im 4/24]
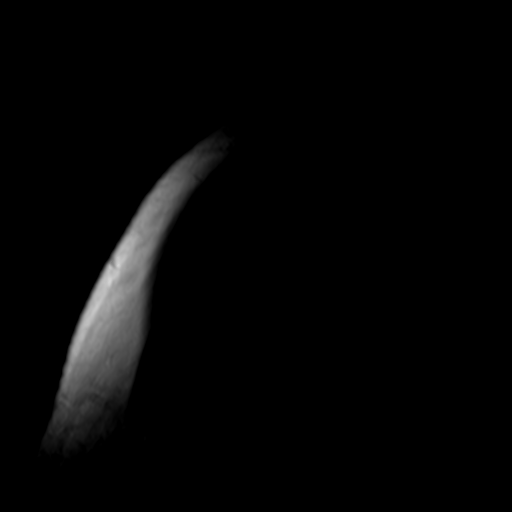
[im 7/24]
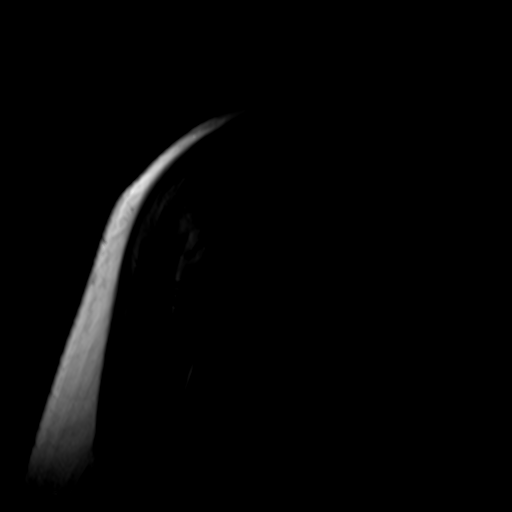
[im 10/24]
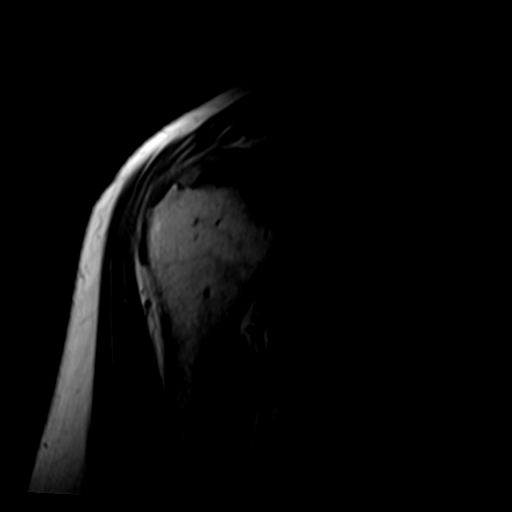
[im 14/24]
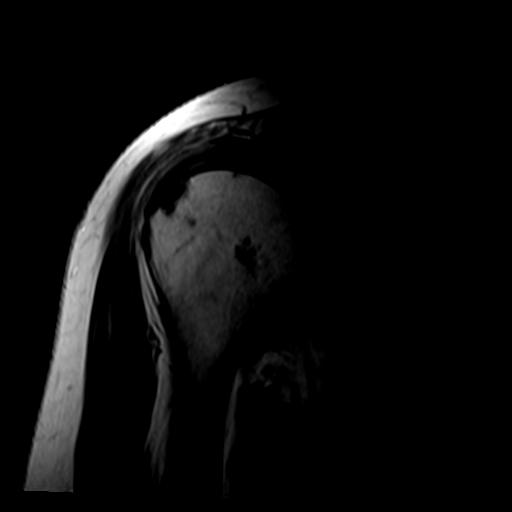
[im 17/24]
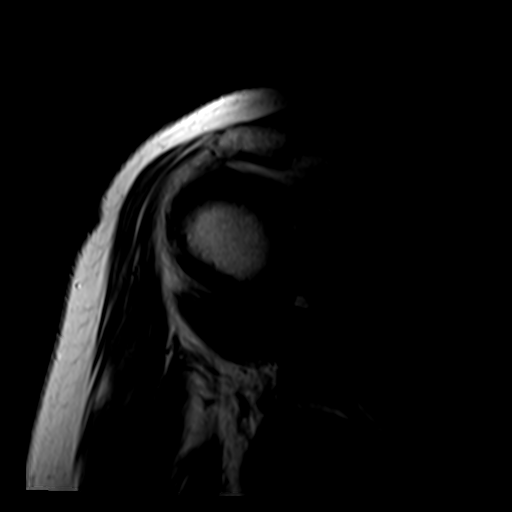
[im 20/24]
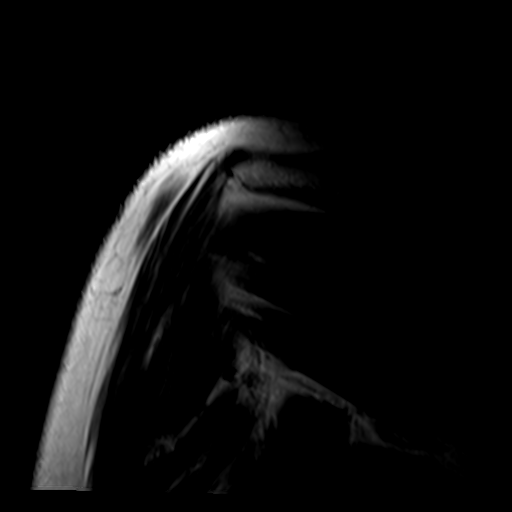
[im 24/24]
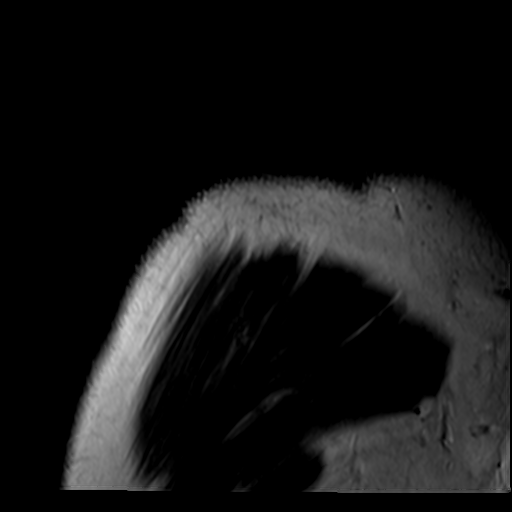

[Series 6: T2 fat-sat · oblique · 4.0mm · 0.62mm/px · 8 of 25 slices shown (3 of 3)]
[im 1/25]
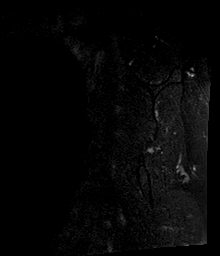
[im 4/25]
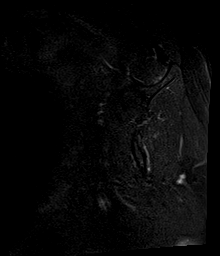
[im 7/25]
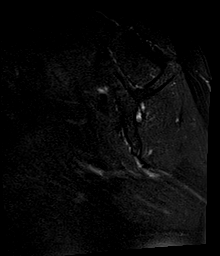
[im 11/25]
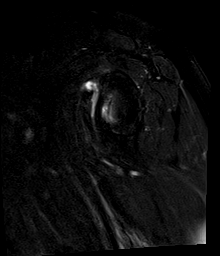
[im 14/25]
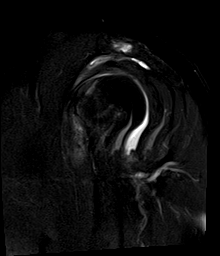
[im 18/25]
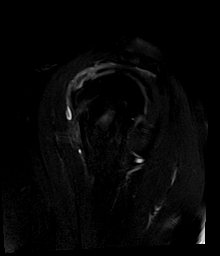
[im 21/25]
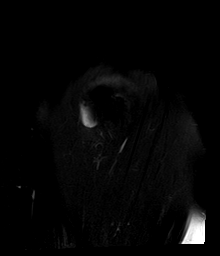
[im 25/25]
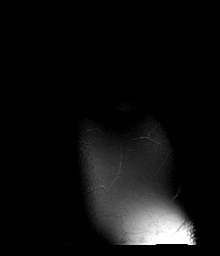

[Series 7: T1 · oblique · 4.0mm · 0.31mm/px · 4 of 25 slices shown]
[im 1/25]
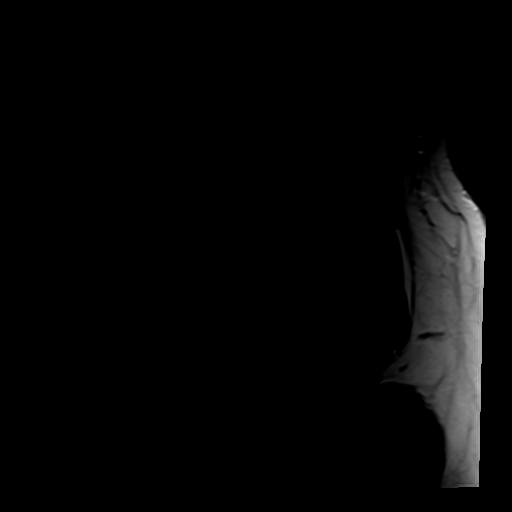
[im 4/25]
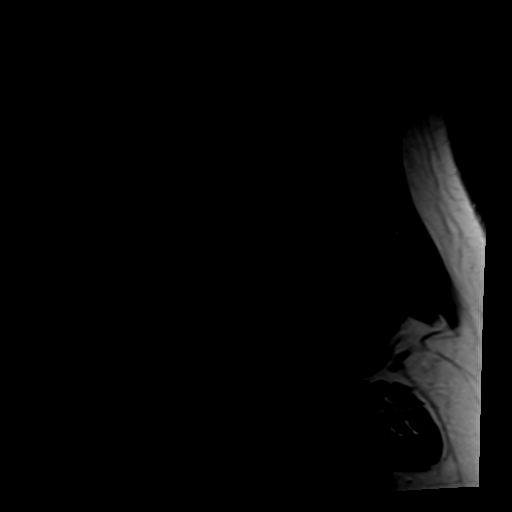
[im 7/25]
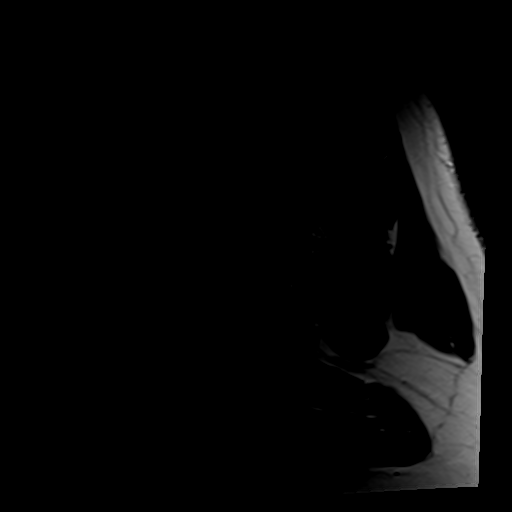
[im 11/25]
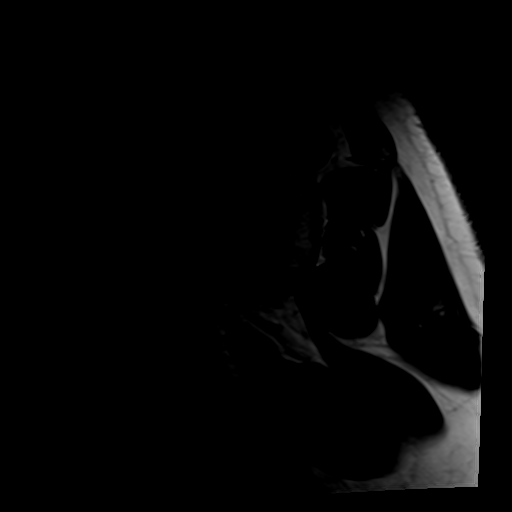

[36 of 40 positions shown; findings below may reference images not displayed]

FINDINGS: Rotator cuff: Significant rotator cuff tendinopathy/tendinosis
mainly involving the supraspinatus and subscapularis tendons. Mild
tendinopathy involving the infraspinatus tendon. Partial-thickness
articular surface tearing of the supraspinatus tendon in the
critical zone region with maximum laminar retraction of the
articular fibers estimated at 12.5 mm. More anteriorly there is a
full-thickness retracted tear involving the footprint attachment
fibers with maximum retraction 12 mm.

The subscapularis tendon is completely torn and retracted up to
cm.

Muscles:  Severe fatty atrophy of the subscapularis muscle.

Biceps long head:  Intact.  Is dislocated medially.

Acromioclavicular Joint: Moderate degenerative changes and small
joint effusion. Type 1-2 acromion. No lateral downsloping. Mild
subacromial spurring.

Glenohumeral Joint: Mild degenerative changes and moderate size
joint effusion.

Labrum:  No definite labral tears.

Bones: No acute bony findings. Subchondral cystic type changes noted
in the greater tuberosity near the rotator cuff attachment. There is
also mild marrow edema in the lesser tuberosity.

Other: Expected fluid in the subacromial/subdeltoid bursa.
IMPRESSION: 1. Full-thickness retracted tears involving the supraspinatus and
subscapularis tendons as detailed above.
2. Intact long head biceps tendon and glenoid labrum. The biceps
tendon is dislocated medially.
3. Moderate AC joint degenerative changes and mild subacromial
spurring.
4. Mild glenohumeral joint degenerative changes with moderate size
joint effusion.

## 2022-08-10 IMAGING — CR DG SHOULDER 2+V*R*
2 series · 2 of 2 positions shown · non-contrast
Comparison: 09/30/2020
COMPARISON: 09/30/2020

Addendum:
CLINICAL DATA: Rule out retained instrument

EXAM:
RIGHT SHOULDER - 2+ VIEW

[AP (1 of 2)]
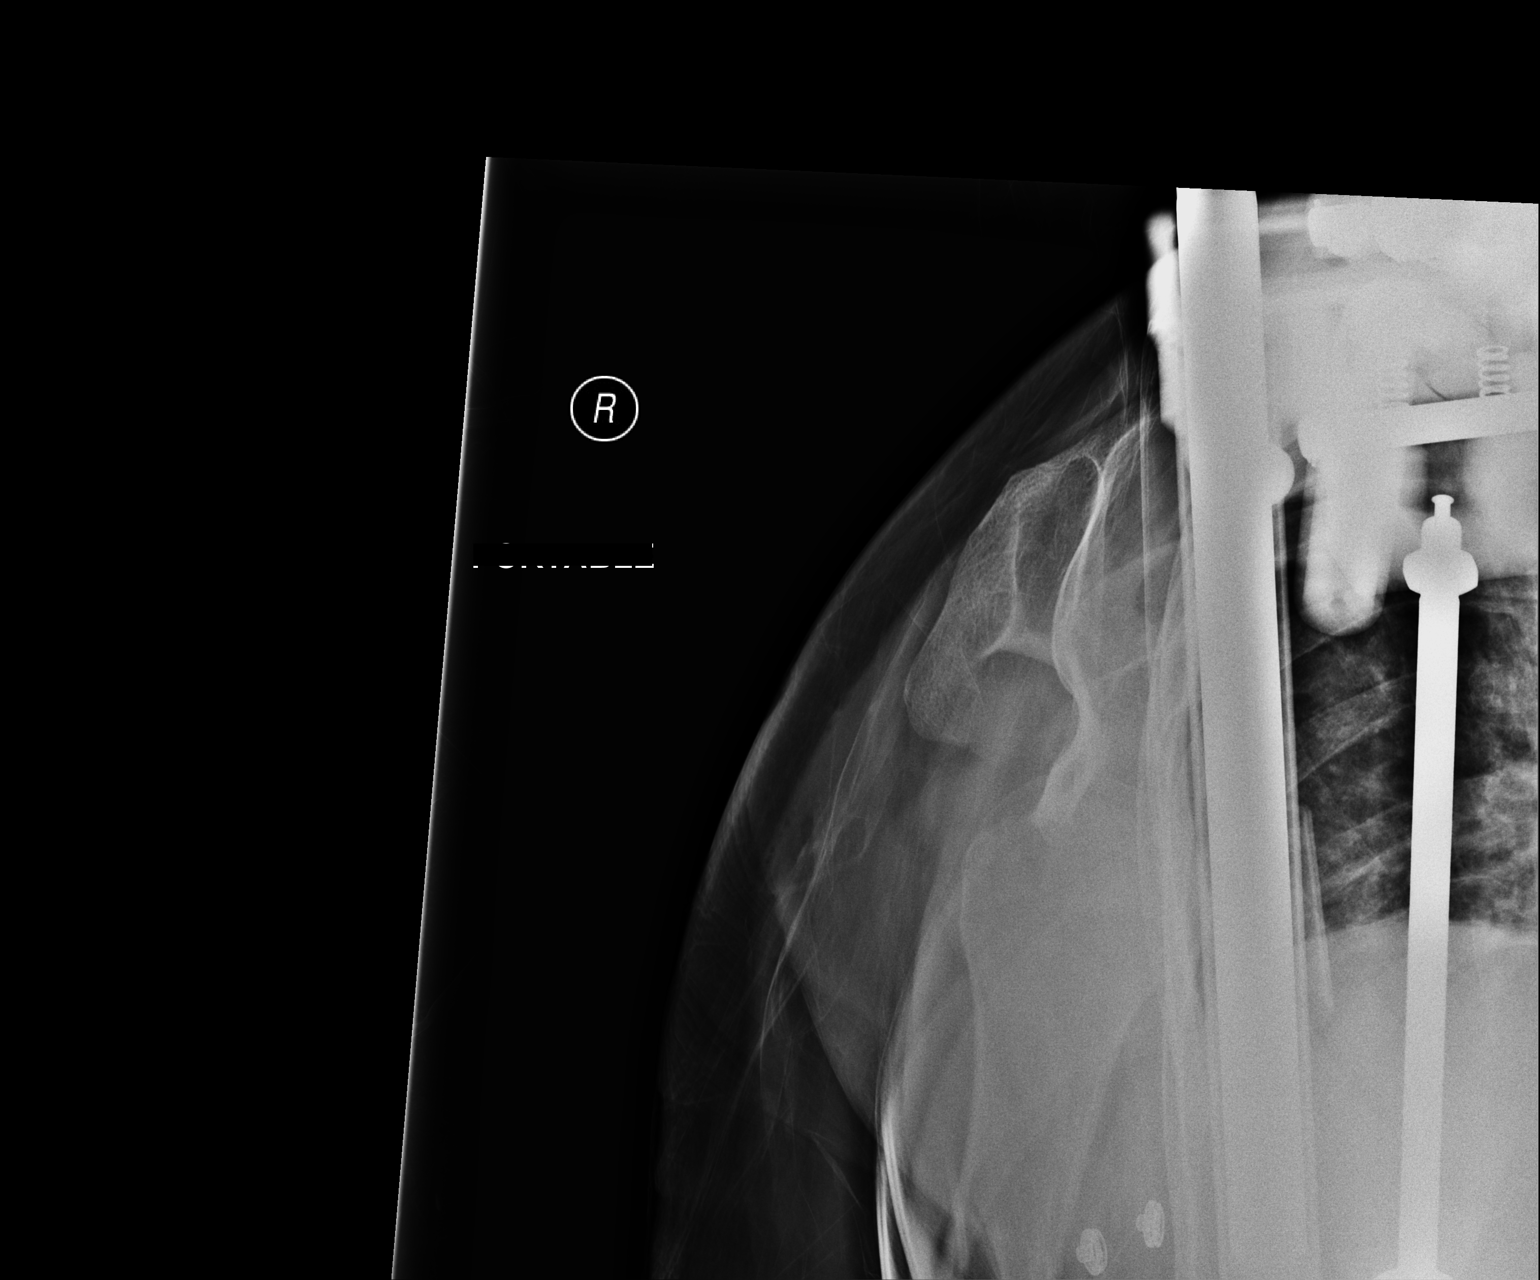

[AP (2 of 2)]
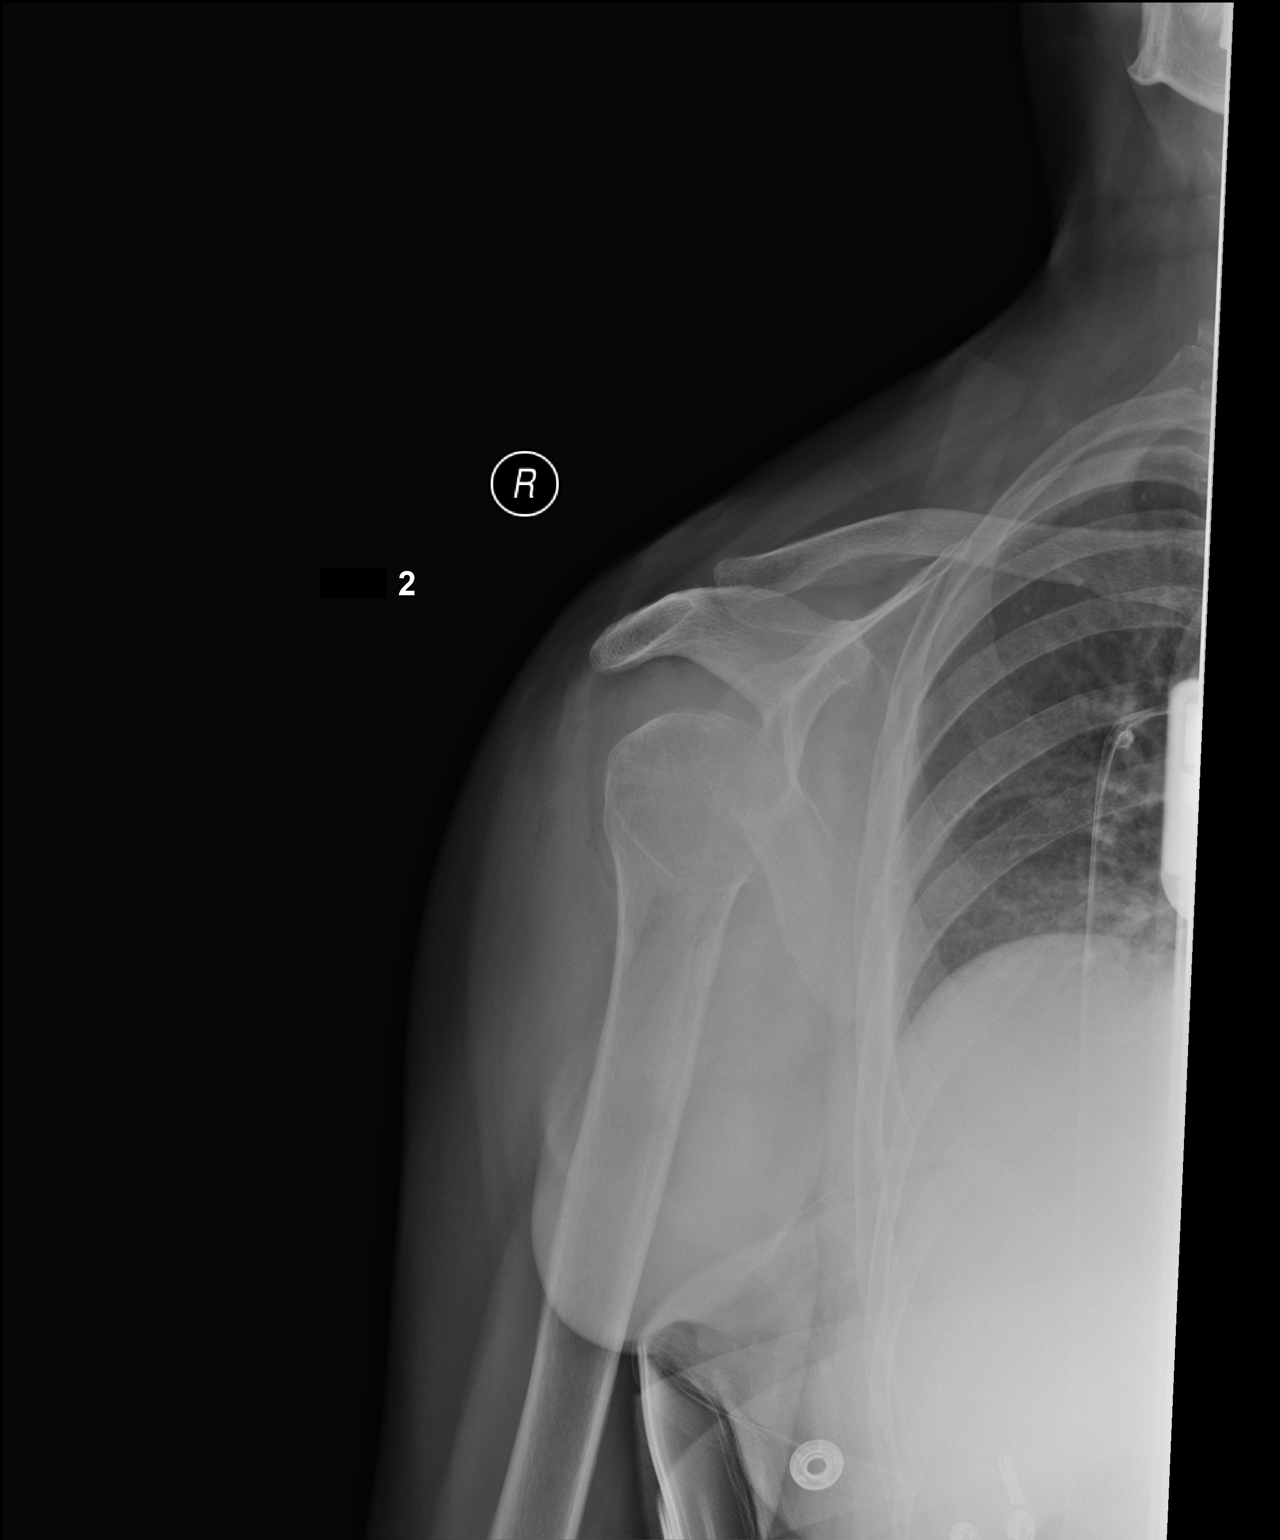

[2 of 2 positions shown; findings below may reference images not displayed]

FINDINGS: Single limited portable view of the shoulder was obtained
intraoperatively. There is substantial metallic objects overlying
the field of view related to the operating table. The visualized
portions of the shoulder and adjacent soft tissues demonstrate no
evidence of a retained surgical instrument.
IMPRESSION: No evidence of retained surgical instrument within the field of
view. Exam is limited secondary to artifact from overlying
hardware/table. A repeat study if possible is recommended to be able
to more confidently exclude the possibility of a retained
instrument.

These results were called by telephone at the time of interpretation
acknowledged these results.

ADDENDUM:
A repeat frontal radiograph of the right shoulder was performed at
7753 hours and submitted for interpretation. Shoulder is more fully
imaged on the current film. No fracture or dislocation. No
radiopaque foreign body is seen within the field of view.

No evidence of retained surgical needle/instrument.

These results were called by telephone at the time of interpretation
on 01/29/2021 at [DATE] to OR team member Yoshida, who verbally
acknowledged these results.

*** End of Addendum ***
FINDINGS: Single limited portable view of the shoulder was obtained
intraoperatively. There is substantial metallic objects overlying
the field of view related to the operating table. The visualized
portions of the shoulder and adjacent soft tissues demonstrate no
evidence of a retained surgical instrument.
IMPRESSION: No evidence of retained surgical instrument within the field of
view. Exam is limited secondary to artifact from overlying
hardware/table. A repeat study if possible is recommended to be able
to more confidently exclude the possibility of a retained
instrument.

These results were called by telephone at the time of interpretation
acknowledged these results.

## 2022-08-18 ENCOUNTER — Other Ambulatory Visit (HOSPITAL_COMMUNITY): Payer: Self-pay

## 2022-08-18 MED ORDER — AZITHROMYCIN 250 MG PO TABS
ORAL_TABLET | ORAL | 0 refills | Status: DC
  Filled 2022-08-18: qty 12, 4d supply, fill #0

## 2022-08-18 MED ORDER — ATOVAQUONE-PROGUANIL HCL 250-100 MG PO TABS
ORAL_TABLET | ORAL | 0 refills | Status: DC
  Filled 2022-08-18: qty 20, 20d supply, fill #0

## 2022-08-23 ENCOUNTER — Other Ambulatory Visit: Payer: Self-pay | Admitting: Obstetrics and Gynecology

## 2022-08-23 DIAGNOSIS — Z136 Encounter for screening for cardiovascular disorders: Secondary | ICD-10-CM

## 2022-09-14 ENCOUNTER — Ambulatory Visit
Admission: RE | Admit: 2022-09-14 | Discharge: 2022-09-14 | Disposition: A | Discharge status: Home / Self Care | Payer: No Typology Code available for payment source | Source: Ambulatory Visit | Attending: Obstetrics and Gynecology | Admitting: Obstetrics and Gynecology

## 2022-09-14 DIAGNOSIS — Z136 Encounter for screening for cardiovascular disorders: Secondary | ICD-10-CM

## 2022-10-24 ENCOUNTER — Ambulatory Visit: Payer: Medicare Other | Admitting: Orthopedic Surgery

## 2022-10-26 ENCOUNTER — Ambulatory Visit (INDEPENDENT_AMBULATORY_CARE_PROVIDER_SITE_OTHER): Payer: Medicare Other | Admitting: Orthopedic Surgery

## 2022-10-26 ENCOUNTER — Telehealth: Payer: Self-pay

## 2022-10-26 ENCOUNTER — Encounter: Payer: Self-pay | Admitting: Orthopedic Surgery

## 2022-10-26 ENCOUNTER — Other Ambulatory Visit (INDEPENDENT_AMBULATORY_CARE_PROVIDER_SITE_OTHER): Payer: Medicare Other

## 2022-10-26 DIAGNOSIS — M75121 Complete rotator cuff tear or rupture of right shoulder, not specified as traumatic: Secondary | ICD-10-CM | POA: Diagnosis not present

## 2022-10-26 DIAGNOSIS — M25561 Pain in right knee: Secondary | ICD-10-CM

## 2022-10-26 DIAGNOSIS — M1711 Unilateral primary osteoarthritis, right knee: Secondary | ICD-10-CM | POA: Diagnosis not present

## 2022-10-26 NOTE — Progress Notes (Signed)
Office Visit Note   Patient: Vanessa [name]           Date of Birth: 06-08-1950           MRN: [id number] Visit Date: 10/26/2022 Requested by: Roxy Manns, MD [address] [place],  [place] [address] PCP: Hix, Maebelle Munroe, MD  Subjective: Chief Complaint  Patient presents with   Right Knee - Pain    HPI: Vanessa [name] is a 72 y.o. female who presents to the office reporting right knee pain.  Does not report any recent injury but did have a twisting injury last year.  Has a history of right ACL reconstruction in the year 2000.  Downhill walking is most painful for her.  She does enjoy walking her dogs.  She states that she does not really trust the right knee..                ROS: All systems reviewed are negative as they relate to the chief complaint within the history of present illness.  Patient denies fevers or chills.  Assessment & Plan: Visit Diagnoses:  1. Complete tear of right rotator cuff, unspecified whether traumatic   2. Right knee pain, unspecified chronicity     Plan: Impression is right knee arthritis following ACL reconstruction.  Aspiration and injection performed today.  Her symptoms are aggravating but not really reaching the level yet for consideration of any major surgery.  We will also preapproved for gel injection and start those once this injection wears off. This patient is diagnosed with osteoarthritis of the knee(s).    Radiographs show evidence of joint space narrowing, osteophytes, subchondral sclerosis and/or subchondral cysts.  This patient has knee pain which interferes with functional and activities of daily living.    This patient has experienced inadequate response, adverse effects and/or intolerance with conservative treatments such as acetaminophen, NSAIDS, topical creams, physical therapy or regular exercise, knee bracing and/or weight loss.   This patient has experienced inadequate response or has a  contraindication to intra articular steroid injections for at least 3 months.   This patient is not scheduled to have a total knee replacement within 6 months of starting treatment with viscosupplementation.   Follow-Up Instructions: No follow-ups on file.   Orders:  Orders Placed This Encounter  Procedures   XR KNEE 3 VIEW RIGHT   No orders of the defined types were placed in this encounter.     Procedures: Large Joint Inj: R knee on 10/26/2022 10:32 PM Indications: diagnostic evaluation, joint swelling and pain Details: 18 G 1.5 in needle, superolateral approach  Arthrogram: No  Medications: 5 mL lidocaine 1 %; 40 mg methylPREDNISolone acetate 40 MG/ML; 4 mL bupivacaine 0.25 % Outcome: tolerated well, no immediate complications Procedure, treatment alternatives, risks and benefits explained, specific risks discussed. Consent was given by the patient. Immediately prior to procedure a time out was called to verify the correct patient, procedure, equipment, support staff and site/side marked as required. Patient was prepped and draped in the usual sterile fashion.       Clinical Data: No additional findings.  Objective: Vital Signs: There were no vitals taken for this visit.  Physical Exam:  Constitutional: Patient appears well-developed HEENT:  Head: Normocephalic Eyes:EOM are normal Neck: Normal range of motion Cardiovascular: Normal rate Pulmonary/chest: Effort normal Neurologic: Patient is alert Skin: Skin is warm Psychiatric: Patient has normal mood and affect  Ortho Exam: Ortho exam demonstrates range of motion of of 3-1  15.  Collaterals are stable.  ACL also feels stable.  Slight varus alignment is present.  No groin pain with internal/external rotation of the leg.  No other masses lymphadenopathy or skin changes noted in the right knee region.  Patellofemoral crepitus is mild.  Specialty Comments:  No specialty comments available.  Imaging: XR KNEE 3 VIEW  RIGHT  Result Date: 10/26/2022 AP lateral merchant radiographs right knee reviewed.  Severe end-stage arthritis is present worse in the medial compartment.  Evidence of prior ACL reconstruction is present with metallic screw in the tibia.  No acute fracture.  Alignment intact    PMFS History: Patient Active Problem List   Diagnosis Date Noted   Complete tear of right rotator cuff    Biceps tendonitis on right    Degenerative superior labral anterior-to-posterior (SLAP) tear of right shoulder    Unilateral primary osteoarthritis, right knee 05/13/2020   Past Medical History:  Diagnosis Date   Aortic valve sclerosis    Arthritis    osteoarthritis   Cancer ([facility])    endometrial   Chronic kidney disease    stage 3   Depression    GERD (gastroesophageal reflux disease)    Heart murmur    AV sclerosis; 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 but no obvious stenosis or regurgitation 12/04/17 echo ([facility])   Hepatitis    History of kidney stones    Hypertension    Hypothyroidism    PONV (postoperative nausea and vomiting)     History reviewed. No pertinent family history.  Past Surgical History:  Procedure Laterality Date   ABDOMINAL HYSTERECTOMY  2008   BREAST SURGERY Right 06/20/2011   lumpectomy for "atypical ductal hyperplasia"   SHOULDER ARTHROSCOPY WITH ROTATOR CUFF REPAIR AND SUBACROMIAL DECOMPRESSION Right 01/29/2021   Procedure: RIGHT SHOULDER ARTHROSCOPY, DEBRIDEMENT, WITH MINI OPEN ROTATOR CUFF TEAR REPAIR & BICEPS TENODESIS;  Surgeon: Cammy Copa, MD;  Location: [facility];  Service: Orthopedics;  Laterality: Right;   TONSILLECTOMY     TUBAL LIGATION     Social History   Occupational History   Not on file  Tobacco Use   Smoking status: Former    Types: Cigarettes   Smokeless tobacco: Never  Vaping Use   Vaping status: Never Used  Substance and Sexual Activity   Alcohol use: Yes    Comment: 4-5 times week - 1 drink   Drug use: Never   Sexual activity: Not on  file

## 2022-10-26 NOTE — Telephone Encounter (Signed)
Auth needed for right knee gel  

## 2022-10-28 DIAGNOSIS — N189 Chronic kidney disease, unspecified: Secondary | ICD-10-CM

## 2022-10-28 HISTORY — DX: Chronic kidney disease, unspecified: N18.9

## 2022-10-28 MED ORDER — BUPIVACAINE HCL 0.25 % IJ SOLN
4.0000 mL | INTRAMUSCULAR | Status: AC | PRN
Start: 2022-10-26 — End: 2022-10-26
  Administered 2022-10-26: 4 mL via INTRA_ARTICULAR

## 2022-10-28 MED ORDER — METHYLPREDNISOLONE ACETATE 40 MG/ML IJ SUSP
40.0000 mg | INTRAMUSCULAR | Status: AC | PRN
Start: 2022-10-26 — End: 2022-10-26
  Administered 2022-10-26: 40 mg via INTRA_ARTICULAR

## 2022-10-28 MED ORDER — LIDOCAINE HCL 1 % IJ SOLN
5.0000 mL | INTRAMUSCULAR | Status: AC | PRN
Start: 2022-10-26 — End: 2022-10-26
  Administered 2022-10-26: 5 mL

## 2022-10-31 NOTE — Telephone Encounter (Signed)
VOB submitted for Monovisc, right knee

## 2022-11-28 ENCOUNTER — Ambulatory Visit: Payer: Medicare Other | Attending: Cardiovascular Disease | Admitting: Cardiovascular Disease

## 2022-11-28 ENCOUNTER — Encounter: Payer: Self-pay | Admitting: Cardiovascular Disease

## 2022-11-28 VITALS — BP 128/72 | HR 75 | Ht 65.0 in | Wt 166.0 lb

## 2022-11-28 DIAGNOSIS — E785 Hyperlipidemia, unspecified: Secondary | ICD-10-CM | POA: Insufficient documentation

## 2022-11-28 DIAGNOSIS — N1832 Chronic kidney disease, stage 3b: Secondary | ICD-10-CM | POA: Insufficient documentation

## 2022-11-28 DIAGNOSIS — I491 Atrial premature depolarization: Secondary | ICD-10-CM | POA: Diagnosis not present

## 2022-11-28 DIAGNOSIS — Z136 Encounter for screening for cardiovascular disorders: Secondary | ICD-10-CM

## 2022-11-28 DIAGNOSIS — I470 Re-entry ventricular arrhythmia: Secondary | ICD-10-CM

## 2022-11-28 DIAGNOSIS — I359 Nonrheumatic aortic valve disorder, unspecified: Secondary | ICD-10-CM | POA: Diagnosis present

## 2022-11-28 DIAGNOSIS — R931 Abnormal findings on diagnostic imaging of heart and coronary circulation: Secondary | ICD-10-CM | POA: Diagnosis not present

## 2022-11-28 DIAGNOSIS — I499 Cardiac arrhythmia, unspecified: Secondary | ICD-10-CM

## 2022-11-28 MED ORDER — METOPROLOL SUCCINATE ER 25 MG PO TB24: 25.0000 mg | ORAL_TABLET | Freq: Every day | ORAL | 3 refills | Status: DC

## 2022-11-28 MED ORDER — ATORVASTATIN CALCIUM 80 MG PO TABS: 80.0000 mg | ORAL_TABLET | Freq: Every day | ORAL | 3 refills | Status: DC

## 2022-11-28 NOTE — Progress Notes (Signed)
Cardiology Office Note    Date:  12/03/2022   ID:  Lacie Draft, DOB 25-Jul-1950, MRN [id number]  PCP:  Hix, Maebelle Munroe, MD  Cardiologist:  Nicki Guadalajara, MD   New cardiology evaluation referred by Dr. Vincente Poli following an elevated calcium score.   History of Present Illness:  Vanessa [name] is a 72 y.o. female who is followed by Dr. Maebelle Munroe Hix at [facility] with a history of hypertension, hypothyroidism, mixed hyperlipidemia, stage IIIb chronic CKD, in addition to.  Fasting glucose.  She is followed by Dr.Woldemichael at [facility] for CKD.  Vanessa [name] has a history of hypertension for at least 20 years.  She denies any history of exertional chest pain or shortness of breath.  She is on a medical regimen which remotely had included diltiazem 360 mg but most recently she is on nifedipine XL 60 mg twice a day, hydralazine 100 mg 3 times a day, and takes furosemide 20 mg as needed.  She has hypothyroidism and is on levothyroxine 150 mcg.  She is on atorvastatin 40 mg for hyperlipidemia.  She has issues for anxiety and is on Effexor XR 37.5 mg daily.  She remains somewhat active and that she walks her dog on a daily basis.  She does Pilates and exercises at least 1 time per week.  She has noticed some episodes of palpitations and skipped heartbeats.  She denies any presyncope or syncope.  She had undergone laboratory by her nephrologist on October 28, 2022 which showed BUN 35 creatinine 1.68, estimated GFR 32.  She had undergone a CT cardiac score on September 14, 2022 which showed total calcium score to 222 Agatston units (81 percentile).  Left main calcification was 98, LAD 109, left circumflex 9, and RCA 6.  She presents to establish cardiology care.   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 but no obvious stenosis or regurgitation 12/04/17 echo ([facility])   Hepatitis    History of kidney stones    Hypertension    Hypothyroidism    PONV (postoperative nausea and vomiting)     Past Surgical History:  Procedure Laterality Date   ABDOMINAL HYSTERECTOMY  2008   BREAST SURGERY Right 06/20/2011   lumpectomy for "atypical ductal hyperplasia"   SHOULDER ARTHROSCOPY WITH ROTATOR CUFF REPAIR AND SUBACROMIAL DECOMPRESSION Right 01/29/2021   Procedure: RIGHT SHOULDER ARTHROSCOPY, DEBRIDEMENT, WITH MINI OPEN ROTATOR CUFF TEAR REPAIR & BICEPS TENODESIS;  Surgeon: Cammy Copa, MD;  Location: [facility] OR;  Service: Orthopedics;  Laterality: Right;   TONSILLECTOMY     TUBAL LIGATION      Current Medications: Outpatient Medications Prior to Visit  Medication Sig Dispense Refill   acetaminophen (TYLENOL) 650 MG CR tablet Take 650 mg by mouth 2 (two) times daily as needed for pain.     Cyanocobalamin (B-12 PO) Take 1 capsule by mouth 3 (three) times a week.     famotidine (PEPCID) 20 MG tablet Take 20 mg by mouth daily.     furosemide (LASIX) 20 MG tablet Take 20 mg by mouth daily as needed for edema.     hydrALAZINE (APRESOLINE) 100 MG tablet Take 100 mg by  mouth 3 (three) times daily.     levothyroxine (SYNTHROID) 150 MCG tablet TAKE 1 TABLET EVERY DAY  AT  6AM     loratadine (CLARITIN) 10 MG tablet Take 10 mg by mouth daily as needed for allergies.     mirtazapine (REMERON) 15 MG tablet Take 15 mg by mouth at bedtime.     Multiple Vitamins-Minerals (HAIR SKIN AND NAILS FORMULA) TABS Take 1-2 tablets by mouth See admin instructions. Take 1 tablet on days when taking the b12 and 2 tablets on the non-b12 days     NIFEdipine (PROCARDIA XL/NIFEDICAL XL) 60 MG 24 hr tablet Take 1 tablet by mouth 2 (two) times daily.     sodium bicarbonate 650 MG tablet Take 650 mg by mouth 2 (two) times daily.     venlafaxine XR  (EFFEXOR-XR) 37.5 MG 24 hr capsule Take 37.5 mg by mouth daily.     VITAMIN D PO Take 1 capsule by mouth 3 (three) times a week.     atorvastatin (LIPITOR) 40 MG tablet Take 40 mg by mouth daily.     azithromycin (ZITHROMAX) 250 MG tablet Take 2 tablets by mouth at one time with 1 immodium in case of travelers diarrhea, may repeat one time if diarrhea persists after 4-6 hours 12 tablet 0   [employer] ASPIRIN ADULT LOW DOSE 81 MG chewable tablet CHEW 1 TABLET BY MOUTH DAILY. 36 tablet 0   diltiazem (CARDIZEM CD) 360 MG 24 hr capsule Take 360 mg by mouth daily.     methocarbamol (ROBAXIN) 500 MG tablet Take 1 tablet (500 mg total) by mouth 4 (four) times daily. 30 tablet 0   ondansetron (ZOFRAN-ODT) 8 MG disintegrating tablet Take 8 mg by mouth 2 (two) times daily as needed for nausea/vomiting.     oxyCODONE (OXY IR/ROXICODONE) 5 MG immediate release tablet Take 1 tablet (5 mg total) by mouth every 4 (four) hours as needed for severe pain. 50 tablet 0   scopolamine (TRANSDERM-SCOP) 1 MG/3DAYS Place 1 patch onto the skin 2 (two) times a week.     atovaquone-proguanil (MALARONE) 250-100 MG TABS tablet Take 1 tablet by mouth daily starting 1 day before and ending 5 days after travel to a malarious area 20 tablet 0   No facility-administered medications prior to visit.     Allergies:   Codeine, Lisinopril, and Morphine   Social History   Socioeconomic History   Marital status: Married    Spouse name: Not on file   Number of children: Not on file   Years of education: Not on file   Highest education level: Not on file  Occupational History   Not on file  Tobacco Use   Smoking status: Former    Types: Cigarettes   Smokeless tobacco: Never  Vaping Use   Vaping status: Never Used  Substance and Sexual Activity   Alcohol use: Yes    Comment: 4-5 times week - 1 drink   Drug use: Never   Sexual activity: Not on file  Other Topics Concern   Not on file  Social History Narrative   Not on file    Social Determinants of Health   Financial Resource Strain: Low Risk  (11/19/2019)   Received from [facility] visits prior to 04/09/2022.   Overall Financial Resource Strain (CARDIA)    Difficulty of Paying Living Expenses: Not very hard  Food Insecurity: Low Risk  (11/23/2022)   Received from [facility]   Hunger Vital Sign  Worried About Programme researcher, broadcasting/film/video in the Last Year: Never true    Ran Out of Food in the Last Year: Never true  Transportation Needs: No Transportation Needs (11/23/2022)   Received from [facility]    In the past 12 months, has lack of reliable transportation kept you from medical appointments, meetings, work or from getting things needed for daily living? : No  Physical Activity: Insufficiently Active (11/19/2019)   Received from [facility] visits prior to 04/09/2022.   Exercise Vital Sign    Days of Exercise per Week: 2 days    Minutes of Exercise per Session: 20 min  Stress: Stress Concern Present (11/19/2019)   Received from [facility] visits prior to 04/09/2022.   [employer] of Occupational Health - Occupational Stress Questionnaire    Feeling of Stress : To some extent  Social Connections: Socially Isolated (11/19/2019)   Received from [facility] visits prior to 04/09/2022.   Social Advertising account executive [NHANES]    Frequency of Communication with Friends and Family: Once a week    Frequency of Social Gatherings with Friends and Family: Once a week    Attends Religious Services: Never    Database administrator or Organizations: No    Attends Engineer, structural: Never    Marital Status: Married    Social history is notable and that she was born in [place] and currently lives in [place].  She is married for 5 years.  No children.  She is a retired Engineer, civil (consulting) and had worked at [employer].  She does drink  occasional whiskey or wine.  She does exercise at least 2 days/week for 45 to 50 minutes doing strength and balance, Pilates, and walks her dog.  Family History: Both parents are deceased, mother at age 6 who had dementia and died during COVID, father age 52 with who had CAD commencing at age 52.  She has 1 brother age 67.  ROS General: Negative; No fevers, chills, or night sweats;  HEENT: Negative; No changes in vision or hearing, sinus congestion, difficulty swallowing Pulmonary: Negative; No cough, wheezing, shortness of breath, hemoptysis Cardiovascular: Negative; No chest pain, presyncope, syncope, palpitations GI: Negative; No nausea, vomiting, diarrhea, or abdominal pain GU: Negative; No dysuria, hematuria, or difficulty voiding Musculoskeletal: Negative; no myalgias, joint pain, or weakness Hematologic/Oncology: Negative; no easy bruising, bleeding Endocrine: Negative; no heat/cold intolerance; no diabetes Neuro: Negative; no changes in balance, headaches Skin: Negative; No rashes or skin lesions Psychiatric: Negative; No behavioral problems, depression Sleep: Negative; No snoring, daytime sleepiness, hypersomnolence, bruxism, restless legs, hypnogognic hallucinations, no cataplexy Other comprehensive 14 point system review is negative.   PHYSICAL EXAM:   VS:  BP 128/72 (BP Location: Left Arm, Patient Position: Sitting, Cuff Size: Normal)   Pulse 75   Ht 5\' 5"  (1.651 m)   Wt 166 lb (75.3 kg)   SpO2 98%   BMI 27.62 kg/m     Repeat blood pressure by me was 132/74  Wt Readings from Last 3 Encounters:  11/28/22 166 lb (75.3 kg)  01/29/21 170 lb (77.1 kg)  01/13/21 167 lb (75.8 kg)    General: Alert, oriented, no distress.  Skin: normal turgor, no rashes, warm and dry HEENT: Normocephalic, atraumatic. Pupils equal round and reactive to light; sclera anicteric; extraocular muscles intact;  Nose without nasal septal hypertrophy Mouth/Parynx benign; Mallinpatti scale3 Neck:  No JVD, no carotid  bruits; normal carotid upstroke Lungs: clear to ausculatation and percussion; no wheezing or rales Chest wall: without tenderness to palpitation Heart: PMI not displaced, RRR, s1 s2 normal, 2/6 systolic murmur aortic area left sternal border, no diastolic murmur, no rubs, gallops, thrills, or heaves Abdomen: soft, nontender; no hepatosplenomehaly, BS+; abdominal aorta nontender and not dilated by palpation. Back: no CVA tenderness Pulses 2+ Musculoskeletal: full range of motion, normal strength, no joint deformities Extremities: no clubbing cyanosis or edema, Homan's sign negative  Neurologic: grossly nonfocal; Cranial nerves grossly wnl Psychologic: Normal mood and affect   Studies/Labs Reviewed:   EKG Interpretation Date/Time:  Monday November 28 2022 16:16:39 EDT Ventricular Rate:  72 PR Interval:  172 QRS Duration:  84 QT Interval:  426 QTC Calculation: 466 R Axis:   -9  Text Interpretation: Sinus rhythm with Premature atrial complexes Cannot rule out Anterior infarct , age undetermined When compared with ECG of 13-Jan-2021 10:19, Premature atrial complexes are now Present Confirmed by Nicki Guadalajara ([id number]) on 12/03/2022 11:19:24 AM    Recent Labs:    Latest Ref Rng & Units 01/13/2021   10:15 AM  BMP  Glucose 70 - 99 mg/dL 329   BUN 8 - 23 mg/dL 25   Creatinine 5.18 - 1.00 mg/dL 8.41   Sodium 660 - 630 mmol/L 140   Potassium 3.5 - 5.1 mmol/L 4.4   Chloride 98 - 111 mmol/L 107   CO2 22 - 32 mmol/L 25   Calcium 8.9 - 10.3 mg/dL 9.7         Latest Ref Rng & Units 01/13/2021   10:15 AM  Hepatic Function  Total Protein 6.5 - 8.1 g/dL 6.8   Albumin 3.5 - 5.0 g/dL 4.0   AST 15 - 41 U/L 28   ALT 0 - 44 U/L 27   Alk Phosphatase 38 - 126 U/L 69   Total Bilirubin 0.3 - 1.2 mg/dL 0.4        Latest Ref Rng & Units 01/13/2021   10:15 AM  CBC  WBC 4.0 - 10.5 K/uL 9.3   Hemoglobin 12.0 - 15.0 g/dL 16.0   Hematocrit 10.9 - 46.0 % 35.6   Platelets 150 -  400 K/uL 334    Lab Results  Component Value Date   MCV 88.1 01/13/2021   No results found for: "TSH" No results found for: "HGBA1C"   BNP No results found for: "BNP"  ProBNP No results found for: "PROBNP"   Lipid Panel  No results found for: "CHOL", "TRIG", "HDL", "CHOLHDL", "VLDL", "LDLCALC", "LDLDIRECT", "LABVLDL"   RADIOLOGY: No results found.   Additional studies/ records that were reviewed today include:   EXAM:  09/14/2022 CT CARDIAC CORONARY ARTERY CALCIUM SCORE   TECHNIQUE: Non-contrast imaging through the heart was performed using prospective ECG gating. Image post processing was performed on an independent workstation, allowing for quantitative analysis of the heart and coronary arteries. Note that this exam targets the heart and the chest was not imaged in its entirety.   COMPARISON:  None available.   FINDINGS: CORONARY CALCIUM SCORES:   Left Main: 98   LAD: 109   LCx: 9   RCA: 6   Total Agatston Score: 222   MESA database percentile: 81st   AORTA MEASUREMENTS:   Ascending Aorta: 3.7 cm   Descending Aorta:2.5 cm   OTHER FINDINGS:   Atherosclerotic calcifications in the thoracic aorta. Calcifications of the aortic valve and mitral annulus. Within the visualized portions of the thorax there are  no suspicious appearing pulmonary nodules or masses, there is no acute consolidative airspace disease, no pleural effusions, no pneumothorax and no lymphadenopathy. Visualized portions of the upper abdomen are unremarkable. There are no aggressive appearing lytic or blastic lesions noted in the visualized portions of the skeleton.   IMPRESSION: 1. Patient's total coronary artery calcium score is 222 which is 81st percentile for patient's of matched age, gender and race/ethnicity. Please note that although the presence of coronary artery calcium documents the presence of coronary artery disease, the severity of this disease and any potential  stenosis cannot be assessed on this noncontrast CT examination. Assessment for potential risk factor modification, dietary therapy or pharmacologic therapy may be warranted, if clinically indicated. 2.  Aortic Atherosclerosis (ICD10-I70.0). 3. There are calcifications of the aortic valve and mitral annulus. Echocardiographic correlation for evaluation of potential valvular dysfunction may be warranted if clinically indicated.      ASSESSMENT:    1. Agatston coronary artery calcium score between 200 and 399   2. Aortic valve disease   3. Premature atrial contractions   4. Stage 3b chronic kidney disease (HCC)   5. Hyperlipidemia with target LDL less than 70     PLAN:  Ms. Chenoa [name] is a 72 year old female who has a 20-year history of hypertension and has developed stage IIIb CKD followed in [place] with most recent serum creatinine at 1.68 and estimated GFR of 32.  She has family history for CAD with her father having CAD since age 22.  Most recently, she has been on a blood pressure regimen which now includes nifedipine XL 60 mg twice a day, hydralazine 100 mg 3 times a day, and she takes furosemide 20 mg daily.  She is followed by nephrology [place].  She has a history of hyperlipidemia.  I do not have the most recent lipid evaluations but she has been on atorvastatin 80 mg.  She underwent a recent coronary calcium score which was elevated at 222 Agatston units with a majority of her calcification in the left main at 98, and LAD at 109 units.  Presently, I am recommending further titration of atorvastatin to 80 mg for more aggressive lipid management.  On physical exam she has a 2/6 systolic murmur in the aortic area.  I am scheduling her for 2D echo Doppler assessment.  With her sensation of occasional palpitations and ectopy noted on ECG, I am adding low-dose metoprolol succinate 25 mg which would be helpful both for underlying CAD and rhythm control.  I am scheduling her  for comprehensive laboratory with fasting c-Met, TSH, CBC, lipid panel, and I will also obtain an LP(a).  I am recommending she initiate aspirin 81 mg.  I will see her in 3 to 4 months for cardiology reevaluation or sooner as needed.   Medication Adjustments/Labs and Tests Ordered: Current medicines are reviewed at length with the patient today.  Concerns regarding medicines are outlined above.  Medication changes, Labs and Tests ordered today are listed in the Patient Instructions below. Patient Instructions  Medication Instructions:  Take the Metoprolol 25mg  one tablet daily.  Take Aspirin 81mg  daily. This medication can be purchased at your local pharmacy.  Increase the Atorvastatin from 40mg  to 80mg  daily.  *If you need a refill on your cardiac medications before your next appointment, please call your pharmacy*   Lab Work: Return for fasting labs, CMET, CBC, TSH, LPA LIPID If you have labs (blood work) drawn today and your tests are completely normal, you  will receive your results only by: MyChart Message (if you have MyChart) OR A paper copy in the mail If you have any lab test that is abnormal or we need to change your treatment, we will call you to review the results.   Testing/Procedures: Your physician has requested that you have an echocardiogram. Echocardiography is a painless test that uses sound waves to create images of your heart. It provides your doctor with information about the size and shape of your heart and how well your heart's chambers and valves are working. This procedure takes approximately one hour. There are no restrictions for this procedure. Please do NOT wear cologne, perfume, aftershave, or lotions (deodorant is allowed).  Please arrive 15 minutes prior to your appointment time.    Follow-Up: At [facility], you and your health needs are our priority.  As part of our continuing mission to provide you with exceptional heart care, we have  created designated Provider Care Teams.  These Care Teams include your primary Cardiologist (physician) and Advanced Practice Providers (APPs -  Physician Assistants and Nurse Practitioners) who all work together to provide you with the care you need, when you need it.  We recommend signing up for the patient portal called "MyChart".  Sign up information is provided on this After Visit Summary.  MyChart is used to connect with patients for Virtual Visits (Telemedicine).  Patients are able to view lab/test results, encounter notes, upcoming appointments, etc.  Non-urgent messages can be sent to your provider as well.   To learn more about what you can do with MyChart, go to [email or web address].    Your next appointment:   3-4 month(s)  Provider:   DR. Nicki Guadalajara    Signed, Nicki Guadalajara, MD  12/03/2022 11:24 AM    [facility] [address], [address], [place], [place]  [address] Phone: [phone]

## 2022-11-28 NOTE — Patient Instructions (Addendum)
Medication Instructions:  Take the Metoprolol 25mg  one tablet daily.  Take Aspirin 81mg  daily. This medication can be purchased at your local pharmacy.  Increase the Atorvastatin from 40mg  to 80mg  daily.  *If you need a refill on your cardiac medications before your next appointment, please call your pharmacy*   Lab Work: Return for fasting labs, CMET, CBC, TSH, LPA LIPID If you have labs (blood work) drawn today and your tests are completely normal, you will receive your results only by: MyChart Message (if you have MyChart) OR A paper copy in the mail If you have any lab test that is abnormal or we need to change your treatment, we will call you to review the results.   Testing/Procedures: Your physician has requested that you have an echocardiogram. Echocardiography is a painless test that uses sound waves to create images of your heart. It provides your doctor with information about the size and shape of your heart and how well your heart's chambers and valves are working. This procedure takes approximately one hour. There are no restrictions for this procedure. Please do NOT wear cologne, perfume, aftershave, or lotions (deodorant is allowed).  Please arrive 15 minutes prior to your appointment time.    Follow-Up: At [facility], you and your health needs are our priority.  As part of our continuing mission to provide you with exceptional heart care, we have created designated Provider Care Teams.  These Care Teams include your primary Cardiologist (physician) and Advanced Practice Providers (APPs -  Physician Assistants and Nurse Practitioners) who all work together to provide you with the care you need, when you need it.  We recommend signing up for the patient portal called "MyChart".  Sign up information is provided on this After Visit Summary.  MyChart is used to connect with patients for Virtual Visits (Telemedicine).  Patients are able to view lab/test results,  encounter notes, upcoming appointments, etc.  Non-urgent messages can be sent to your provider as well.   To learn more about what you can do with MyChart, go to [email or web address].    Your next appointment:   3-4 month(s)  Provider:   DR. Nicki Guadalajara

## 2022-12-03 ENCOUNTER — Encounter: Payer: Self-pay | Admitting: Cardiovascular Disease

## 2022-12-15 ENCOUNTER — Encounter: Payer: Self-pay | Admitting: Orthopedic Surgery

## 2022-12-15 ENCOUNTER — Other Ambulatory Visit: Payer: Self-pay

## 2022-12-15 DIAGNOSIS — M1711 Unilateral primary osteoarthritis, right knee: Secondary | ICD-10-CM

## 2022-12-15 NOTE — Telephone Encounter (Signed)
Called and left a VM advising patient to CB to schedule for gel injection with Dr. August Saucer or Franky Macho.  See referrals tab

## 2022-12-27 ENCOUNTER — Ambulatory Visit (HOSPITAL_COMMUNITY): Payer: Medicare Other | Attending: Cardiovascular Disease

## 2022-12-27 DIAGNOSIS — R931 Abnormal findings on diagnostic imaging of heart and coronary circulation: Secondary | ICD-10-CM | POA: Insufficient documentation

## 2022-12-27 DIAGNOSIS — I359 Nonrheumatic aortic valve disorder, unspecified: Secondary | ICD-10-CM | POA: Insufficient documentation

## 2022-12-27 LAB — CBC
Hematocrit: 36.3 % (ref 34.0–46.6)
Hemoglobin: 12 g/dL (ref 11.1–15.9)
MCH: 28.1 pg (ref 26.6–33.0)
MCHC: 33.1 g/dL (ref 31.5–35.7)
MCV: 85 fL (ref 79–97)
Platelets: 345 10*3/uL (ref 150–450)
RBC: 4.27 x10E6/uL (ref 3.77–5.28)
RDW: 13.1 % (ref 11.7–15.4)
WBC: 7.7 10*3/uL (ref 3.4–10.8)

## 2022-12-27 LAB — COMPREHENSIVE METABOLIC PANEL
ALT: 27 IU/L (ref 0–32)
AST: 33 IU/L (ref 0–40)
Albumin: 4.4 g/dL (ref 3.8–4.8)
Alkaline Phosphatase: 71 [IU]/L (ref 44–121)
BUN/Creatinine Ratio: 20 (ref 12–28)
BUN: 32 mg/dL — ABNORMAL HIGH (ref 8–27)
Bilirubin Total: 0.4 mg/dL (ref 0.0–1.2)
CO2: 23 mmol/L (ref 20–29)
Calcium: 10 mg/dL (ref 8.7–10.3)
Chloride: 105 mmol/L (ref 96–106)
Creatinine, Ser: 1.63 mg/dL — ABNORMAL HIGH (ref 0.57–1.00)
Globulin, Total: 2.6 g/dL (ref 1.5–4.5)
Glucose: 96 mg/dL (ref 70–99)
Potassium: 4.8 mmol/L (ref 3.5–5.2)
Sodium: 140 mmol/L (ref 134–144)
Total Protein: 7 g/dL (ref 6.0–8.5)
eGFR: 34 mL/min/{1.73_m2} — ABNORMAL LOW (ref >59)

## 2022-12-27 LAB — ECHOCARDIOGRAM COMPLETE
Area-P 1/2: 2.25 cm2
MV VTI: 1.61 cm2
P 1/2 time: 119.8 ms
S' Lateral: 2.4 cm

## 2022-12-27 LAB — LIPID PANEL
Chol/HDL Ratio: 2.7 ratio (ref 0.0–4.4)
Cholesterol, Total: 188 mg/dL (ref 100–199)
HDL: 69 mg/dL (ref >39)
LDL Chol Calc (NIH): 94 mg/dL (ref 0–99)
Triglycerides: 145 mg/dL (ref 0–149)
VLDL Cholesterol Cal: 25 mg/dL (ref 5–40)

## 2022-12-27 LAB — TSH: TSH: 5.54 u[IU]/mL — ABNORMAL HIGH (ref 0.450–4.500)

## 2022-12-27 LAB — LIPOPROTEIN A (LPA): Lipoprotein (a): 404.8 nmol/L — ABNORMAL HIGH (ref <75.0)

## 2023-01-04 ENCOUNTER — Encounter: Payer: Self-pay | Admitting: Orthopedic Surgery

## 2023-01-04 ENCOUNTER — Other Ambulatory Visit: Payer: Self-pay | Admitting: Medical Genetics

## 2023-01-04 ENCOUNTER — Ambulatory Visit: Payer: Medicare Other | Admitting: Orthopedic Surgery

## 2023-01-04 VITALS — Ht 65.0 in | Wt 166.0 lb

## 2023-01-04 DIAGNOSIS — M1711 Unilateral primary osteoarthritis, right knee: Secondary | ICD-10-CM

## 2023-01-04 NOTE — Progress Notes (Unsigned)
   Procedure Note  Patient: Vanessa [name]             Date of Birth: October 14, 1950           MRN: [id number]             Visit Date: 01/04/2023  Procedures: Visit Diagnoses:  1. Arthritis of right knee     No procedures performed

## 2023-01-05 DIAGNOSIS — M1711 Unilateral primary osteoarthritis, right knee: Secondary | ICD-10-CM | POA: Diagnosis not present

## 2023-01-05 MED ORDER — HYALURONAN 88 MG/4ML IX SOSY
88.0000 mg | PREFILLED_SYRINGE | INTRA_ARTICULAR | Status: AC | PRN
Start: 2023-01-05 — End: 2023-01-05
  Administered 2023-01-05: 88 mg via INTRA_ARTICULAR

## 2023-01-05 MED ORDER — LIDOCAINE HCL 1 % IJ SOLN
5.0000 mL | INTRAMUSCULAR | Status: AC | PRN
Start: 2023-01-05 — End: 2023-01-05
  Administered 2023-01-05: 5 mL

## 2023-01-11 ENCOUNTER — Other Ambulatory Visit: Payer: Self-pay | Admitting: Medical Genetics

## 2023-01-17 ENCOUNTER — Telehealth: Payer: Self-pay

## 2023-01-17 ENCOUNTER — Telehealth: Payer: Self-pay | Admitting: Cardiovascular Disease

## 2023-01-17 MED ORDER — EZETIMIBE 10 MG PO TABS: 10.0000 mg | ORAL_TABLET | Freq: Every day | ORAL | 3 refills | Status: DC

## 2023-01-17 NOTE — Telephone Encounter (Signed)
Pt called in returning call to [facility] for lab results   Best number [phone]

## 2023-01-17 NOTE — Telephone Encounter (Signed)
Spoke with patient regarding lab results. She has agreed to take the Zetia 10mg . Sent to [facility] pharmacy.

## 2023-01-17 NOTE — Telephone Encounter (Signed)
Spoke with pt

## 2023-01-26 ENCOUNTER — Other Ambulatory Visit: Payer: Self-pay

## 2023-01-26 MED ORDER — ATORVASTATIN CALCIUM 80 MG PO TABS: 80.0000 mg | ORAL_TABLET | Freq: Every day | ORAL | 2 refills | Status: DC

## 2023-01-26 MED ORDER — METOPROLOL SUCCINATE ER 25 MG PO TB24: 25.0000 mg | ORAL_TABLET | Freq: Every day | ORAL | 2 refills | Status: DC

## 2023-02-15 ENCOUNTER — Other Ambulatory Visit (HOSPITAL_COMMUNITY): Payer: Medicare Other

## 2023-02-23 ENCOUNTER — Other Ambulatory Visit (HOSPITAL_COMMUNITY): Payer: Self-pay

## 2023-02-27 ENCOUNTER — Other Ambulatory Visit (HOSPITAL_COMMUNITY)
Admission: RE | Admit: 2023-02-27 | Discharge: 2023-02-27 | Disposition: A | Discharge status: Home / Self Care | Payer: Self-pay | Source: Ambulatory Visit | Attending: Oncology | Admitting: Oncology

## 2023-03-10 LAB — GENECONNECT MOLECULAR SCREEN: Genetic Analysis Overall Interpretation: NEGATIVE

## 2023-03-28 ENCOUNTER — Other Ambulatory Visit (INDEPENDENT_AMBULATORY_CARE_PROVIDER_SITE_OTHER): Payer: Medicare Other

## 2023-03-28 ENCOUNTER — Encounter: Payer: Self-pay | Admitting: Orthopaedic Surgery

## 2023-03-28 ENCOUNTER — Ambulatory Visit (INDEPENDENT_AMBULATORY_CARE_PROVIDER_SITE_OTHER): Payer: Medicare Other | Admitting: Orthopaedic Surgery

## 2023-03-28 VITALS — BP 157/76 | HR 71 | Ht 65.0 in | Wt 167.0 lb

## 2023-03-28 DIAGNOSIS — M545 Low back pain, unspecified: Secondary | ICD-10-CM | POA: Diagnosis not present

## 2023-03-29 NOTE — Progress Notes (Signed)
 Office Visit Note   Patient: Vanessa [name]           Date of Birth: 03-29-1950           MRN: [id number] Visit Date: 03/28/2023              Requested by: Roxy Manns, MD No address on file PCP: Hix, Maebelle Munroe, MD   Assessment & Plan: Visit Diagnoses:  1. Acute bilateral low back pain, unspecified whether sciatica present     Plan: Will set patient up for some physical therapy with her spondylolisthesis L5-S1 with foraminal stenosis greater on the right side than left.  Follow-Up Instructions: Recheck 4 to 5 weeks.  Orders:  Orders Placed This Encounter  Procedures   XR Lumbar Spine 2-3 Views   Ambulatory referral to Physical Therapy   No orders of the defined types were placed in this encounter.     Procedures: No procedures performed   Clinical Data: No additional findings.   Subjective: Chief Complaint  Patient presents with   Lower Back - Pain    HPI 73 year old female with back pain radicular symptoms into the buttocks worse on the right than left onset a month ago she has been limping tried to play golf can only play 3 holes had to quit.  Problems walking at normal speeds no associated bowel or bladder symptoms.  Problems getting comfortable.  She has noted pain if she puts her leg up on a stool with radicular symptoms down to her ankle.  Review of Systems past history of ACL reconstruction some biceps tendinitis on the right with SLAP tear surgery by Dr. August Saucer 2022.   Objective: Vital Signs: BP (!) 157/76   Pulse 71   Ht 5\' 5"  (1.651 m)   Wt 167 lb (75.8 kg)   BMI 27.79 kg/m   Physical Exam Constitutional:      Appearance: She is well-developed.  HENT:     Head: Normocephalic.     Right Ear: External ear normal.     Left Ear: External ear normal. There is no impacted cerumen.  Eyes:     Pupils: Pupils are equal, round, and reactive to light.  Neck:     Thyroid: No thyromegaly.     Trachea: No tracheal deviation.   Cardiovascular:     Rate and Rhythm: Normal rate.  Pulmonary:     Effort: Pulmonary effort is normal.  Abdominal:     Palpations: Abdomen is soft.  Musculoskeletal:     Cervical back: No rigidity.  Skin:    General: Skin is warm and dry.  Neurological:     Mental Status: She is alert and oriented to person, place, and time.  Psychiatric:        Behavior: Behavior normal.     Ortho Exam patient was pain with straight leg raising 9 degrees on the right negative popliteal compression test positive sciatic notch tenderness on the right greater than left.  Minimal trochanteric bursal tenderness.  She is amatory with a right leg limp no restriction of internal/external rotation of her hip joints.  Specialty Comments:  No specialty comments available.  Imaging: AP lateral lumbar images are obtained and reviewed there is left lumbar curvature less than 20 degrees.  Anterolisthesis L5-S1 with degenerative facet changes grade 1.  Some aortic calcification noted.  Impression: Negative for acute changes.  Degenerative anterolisthesis L5-S1.   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  Past Surgical History:  Procedure Laterality Date   ABDOMINAL  HYSTERECTOMY  2008   BREAST SURGERY Right 06/20/2011   lumpectomy for "atypical ductal hyperplasia"   SHOULDER ARTHROSCOPY WITH ROTATOR CUFF REPAIR AND SUBACROMIAL DECOMPRESSION Right 01/29/2021   Procedure: RIGHT SHOULDER ARTHROSCOPY, DEBRIDEMENT, WITH MINI OPEN ROTATOR CUFF TEAR REPAIR & BICEPS TENODESIS;  Surgeon: Cammy Copa, MD;  Location: [facility] OR;  Service: Orthopedics;  Laterality: Right;   TONSILLECTOMY     TUBAL LIGATION     Social History   Occupational History   Not on file  Tobacco Use   Smoking status: Former    Types: Cigarettes   Smokeless tobacco: Never  Vaping Use   Vaping status: Never Used  Substance and Sexual Activity   Alcohol use: Yes    Comment: 4-5 times week - 1 drink   Drug use: Never   Sexual activity: Not on file

## 2023-04-04 ENCOUNTER — Ambulatory Visit: Payer: Medicare Other | Attending: Cardiovascular Disease | Admitting: Cardiovascular Disease

## 2023-04-04 ENCOUNTER — Encounter: Payer: Self-pay | Admitting: Cardiovascular Disease

## 2023-04-04 DIAGNOSIS — E785 Hyperlipidemia, unspecified: Secondary | ICD-10-CM | POA: Diagnosis present

## 2023-04-04 DIAGNOSIS — I491 Atrial premature depolarization: Secondary | ICD-10-CM | POA: Diagnosis present

## 2023-04-04 DIAGNOSIS — I3481 Nonrheumatic mitral (valve) annulus calcification: Secondary | ICD-10-CM

## 2023-04-04 DIAGNOSIS — R931 Abnormal findings on diagnostic imaging of heart and coronary circulation: Secondary | ICD-10-CM | POA: Diagnosis not present

## 2023-04-04 DIAGNOSIS — E7841 Elevated Lipoprotein(a): Secondary | ICD-10-CM | POA: Diagnosis present

## 2023-04-04 DIAGNOSIS — I358 Other nonrheumatic aortic valve disorders: Secondary | ICD-10-CM | POA: Diagnosis present

## 2023-04-04 NOTE — Patient Instructions (Signed)
 Medication Instructions:  No medication changes were made during today's visit  *If you need a refill on your cardiac medications before your next appointment, please call your pharmacy*   Lab Work: Return for fasting labs If you have labs (blood work) drawn today and your tests are completely normal, you will receive your results only by: MyChart Message (if you have MyChart) OR A paper copy in the mail If you have any lab test that is abnormal or we need to change your treatment, we will call you to review the results.   Testing/Procedures: No procedures ordered today.    Follow-Up: At [facility], you and your health needs are our priority.  As part of our continuing mission to provide you with exceptional heart care, we have created designated Provider Care Teams.  These Care Teams include your primary Cardiologist (physician) and Advanced Practice Providers (APPs -  Physician Assistants and Nurse Practitioners) who all work together to provide you with the care you need, when you need it.  We recommend signing up for the patient portal called "MyChart".  Sign up information is provided on this After Visit Summary.  MyChart is used to connect with patients for Virtual Visits (Telemedicine).  Patients are able to view lab/test results, encounter notes, upcoming appointments, etc.  Non-urgent messages can be sent to your provider as well.   To learn more about what you can do with MyChart, go to [email or web address].    Your next appointment:   6 month(s)  Provider:   Italy Hilty, MD     Other Instructions       If you have any questions or concerns regarding your c-pap, bi-pap or sleep accessories, please contact Brandie Rorie at [phone].           1st Floor: - Lobby - Registration  - Pharmacy  - Lab - Cafe   2nd Floor: - PV Lab - Diagnostic Testing (echo, CT, nuclear med)   3rd Floor: - Vacant   4th Floor: - TCTS (cardiothoracic surgery) -  [facility] - [facility] - Vascular Surgery  - Vascular Ultrasound   5th Floor: - [facility] (general and EP) - Clinical Pharmacy for coumadin, hypertension, lipid, weight-loss medications, and med management appointments      Valet parking services will be available as well.

## 2023-04-04 NOTE — Progress Notes (Addendum)
 Cardiology Office Note    Date:  04/04/2023   ID:  Vanessa [name], Vanessa [name] February 27, 1950, MRN [id number]  PCP:  Hix, Maebelle Munroe, MD  Cardiologist:  Nicki Guadalajara, MD   4 month F/U cardiology evaluation initially referred by Dr. Vincente Poli following an elevated calcium score.   History of Present Illness:  Vanessa [name] is a 73 y.o. female who is followed by Dr. Maebelle Munroe Hix at [facility] with a history of hypertension, hypothyroidism, mixed hyperlipidemia, stage IIIb chronic CKD, in addition to.  She is followed by Dr.Woldemichael at [facility] for CKD.  Vanessa [name] has a history of hypertension for at least 20 years.  She denies any history of exertional chest pain or shortness of breath.  She is on a medical regimen which remotely had included diltiazem 360 mg but most recently she is on nifedipine XL 60 mg twice a day, hydralazine 100 mg 3 times a day, and takes furosemide 20 mg as needed.  She has hypothyroidism and is on levothyroxine 150 mcg.  She is on atorvastatin 40 mg for hyperlipidemia.  She has issues for anxiety and is on Effexor XR 37.5 mg daily.  She remains somewhat active and that she walks her dog on a daily basis.  She does Pilates and exercises at least 1 time per week.  She has noticed some episodes of palpitations and skipped heartbeats.  She denies any presyncope or syncope.  She had undergone laboratory by her nephrologist on October 28, 2022 which showed BUN 35 creatinine 1.68, estimated GFR 32.  She had undergone a CT cardiac score on September 14, 2022 which showed total calcium score to 222 Agatston units (81 percentile).  Left main calcification was 98, LAD 109, left circumflex 9, and RCA 6.    I saw her for my initial cardiology evaluation on 11/28/2022.  During that evaluation I extensively reviewed her prior history, laboratory, and cardiac score.  She had been on atorvastatin 40 mg and I recommended further titration to 80 mg for more aggressive  lipid management.  She had a 2/6 systolic murmur in the aortic area and I scheduled her for 2D echo Doppler study test for aortic sclerosis or stenosis.  With her sensation of occasional palpitations and ectopy noted on ECG I added low-dose metoprolol succinate 25 mg.  I recommended she undergo repeat comprehensive laboratory c-Met, TSH, CBC, lipid panel and was also to check an LP(a).  I recommended she initiate aspirin 81 mg.  Laboratory obtained several weeks later showed BUN 32 creatinine 1.63.  LFTs were normal.  Total cholesterol 145, triglycerides 69, HDL 25, LDL cholesterol 94.  LP(a) was markedly increased at 404.8.  TSH was minimally increased at 5.40.  He was notified to add Zetia 10 mg to her recently increased atorvastatin 80 mg.  Echo Doppler study performed on December 27, 2022 showed normal LV function with EF 55 to 60% and grade 1 diastolic dysfunction.  LA was moderately dilated and had mildly elevated pressure.  There was very mild mitral stenosis with significant mitral annular calcification.  There was mild calcification of a trileaflet aortic valve with aortic valve sclerosis without stenosis.  The last several months, Vanessa [name] has felt well.  She is tolerated atorvastatin 80 mg and Zetia for 10 mg.  She has not had repeat laboratory.  She denies chest pain or shortness of breath.  Tells me that her primary MD had rechecked her TSH which had normalized.  She presents for reevaluation.   Past Medical History:  Diagnosis Date   Aortic valve sclerosis    Arthritis    osteoarthritis   Cancer (HCC)    endometrial   Chronic kidney disease    stage 3   Depression    GERD (gastroesophageal reflux disease)    Heart murmur    AV sclerosis; 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 but no obvious stenosis or regurgitation 12/04/17 echo ([facility])   Hepatitis    History of kidney stones    Hypertension    Hypothyroidism    PONV (postoperative nausea and vomiting)     Past Surgical  History:  Procedure Laterality Date   ABDOMINAL HYSTERECTOMY  2008   BREAST SURGERY Right 06/20/2011   lumpectomy for "atypical ductal hyperplasia"   SHOULDER ARTHROSCOPY WITH ROTATOR CUFF REPAIR AND SUBACROMIAL DECOMPRESSION Right 01/29/2021   Procedure: RIGHT SHOULDER ARTHROSCOPY, DEBRIDEMENT, WITH MINI OPEN ROTATOR CUFF TEAR REPAIR & BICEPS TENODESIS;  Surgeon: Cammy Copa, MD;  Location: [facility] OR;  Service: Orthopedics;  Laterality: Right;   TONSILLECTOMY     TUBAL LIGATION      Current Medications: Outpatient Medications Prior to Visit  Medication Sig Dispense Refill   acetaminophen (TYLENOL) 650 MG CR tablet Take 650 mg by mouth 2 (two) times daily as needed for pain.     atorvastatin (LIPITOR) 80 MG tablet Take 1 tablet (80 mg total) by mouth daily. 90 tablet 2   [employer] ASPIRIN ADULT LOW DOSE 81 MG chewable tablet CHEW 1 TABLET BY MOUTH DAILY. 36 tablet 0   Cyanocobalamin (B-12 PO) Take 1 capsule by mouth 3 (three) times a week.     ezetimibe (ZETIA) 10 MG tablet Take 1 tablet (10 mg total) by mouth daily. 90 tablet 3   famotidine (PEPCID) 20 MG tablet Take 20 mg by mouth daily.     furosemide (LASIX) 20 MG tablet Take 20 mg by mouth daily as needed for edema.     hydrALAZINE (APRESOLINE) 100 MG tablet Take 100 mg by mouth 3 (three) times daily.     levothyroxine (SYNTHROID) 150 MCG tablet TAKE 1 TABLET EVERY DAY  AT  6AM     loratadine (CLARITIN) 10 MG tablet Take 10 mg by mouth daily as needed for allergies.     metoprolol succinate (TOPROL XL) 25 MG 24 hr tablet Take 1 tablet (25 mg total) by mouth daily. 90 tablet 2   mirtazapine (REMERON) 15 MG tablet Take 15 mg by mouth at bedtime.     Multiple Vitamins-Minerals (HAIR SKIN AND NAILS FORMULA) TABS Take 1-2 tablets by mouth See admin instructions. Take 1 tablet on days when taking the b12 and 2 tablets on the non-b12 days     NIFEdipine (PROCARDIA XL/NIFEDICAL XL) 60 MG 24 hr tablet Take 1 tablet by mouth 2 (two) times daily.      sodium bicarbonate 650 MG tablet Take 650 mg by mouth 2 (two) times daily.     venlafaxine XR (EFFEXOR-XR) 37.5 MG 24 hr capsule Take 37.5 mg by mouth daily.     VITAMIN D PO Take 1 capsule by mouth 3 (three) times a week.     azithromycin (ZITHROMAX) 250 MG tablet Take 2 tablets by mouth at one time with 1 immodium in case of travelers diarrhea, may repeat one time if diarrhea persists after 4-6 hours (Patient not taking: Reported on 04/04/2023) 12 tablet 0   methocarbamol (ROBAXIN) 500 MG tablet Take 1 tablet (500 mg total) by  mouth 4 (four) times daily. (Patient not taking: Reported on 04/04/2023) 30 tablet 0   No facility-administered medications prior to visit.     Allergies:   Lisinopril, Morphine, and Codeine   Social History   Socioeconomic History   Marital status: Married    Spouse name: Not on file   Number of children: Not on file   Years of education: Not on file   Highest education level: Not on file  Occupational History   Not on file  Tobacco Use   Smoking status: Former    Types: Cigarettes   Smokeless tobacco: Never  Vaping Use   Vaping status: Never Used  Substance and Sexual Activity   Alcohol use: Yes    Comment: 4-5 times week - 1 drink   Drug use: Never   Sexual activity: Not on file  Other Topics Concern   Not on file  Social History Narrative   Not on file   Social Drivers of Health   Financial Resource Strain: Low Risk  (11/19/2019)   Received from [facility] visits prior to 04/09/2022.   Overall Financial Resource Strain (CARDIA)    Difficulty of Paying Living Expenses: Not very hard  Food Insecurity: Low Risk  (11/23/2022)   Received from [facility]   Hunger Vital Sign    Worried About Running Out of Food in the Last Year: Never true    Ran Out of Food in the Last Year: Never true  Transportation Needs: No Transportation Needs (11/23/2022)   Received from [facility]    In the past 12 months,  has lack of reliable transportation kept you from medical appointments, meetings, work or from getting things needed for daily living? : No  Physical Activity: Insufficiently Active (11/19/2019)   Received from [facility] visits prior to 04/09/2022.   Exercise Vital Sign    Days of Exercise per Week: 2 days    Minutes of Exercise per Session: 20 min  Stress: Stress Concern Present (11/19/2019)   Received from [facility] visits prior to 04/09/2022.   [employer] of Occupational Health - Occupational Stress Questionnaire    Feeling of Stress : To some extent  Social Connections: Socially Isolated (11/19/2019)   Received from [facility] visits prior to 04/09/2022.   Social Advertising account executive [NHANES]    Frequency of Communication with Friends and Family: Once a week    Frequency of Social Gatherings with Friends and Family: Once a week    Attends Religious Services: Never    Database administrator or Organizations: No    Attends Engineer, structural: Never    Marital Status: Married    Social history is notable and that she was born in [place] and currently lives in [place].  She is married for 5 years.  No children.  She is a retired Engineer, civil (consulting) and had worked at [employer].  She does drink occasional whiskey or wine.  She does exercise at least 2 days/week for 45 to 50 minutes doing strength and balance, Pilates, and walks her dog.  Family History: Both parents are deceased, mother at age 54 who had dementia and died during COVID, father age 56 with who had CAD commencing at age 11.  She has 1 brother age 60.  ROS General: Negative; No fevers, chills, or night sweats;  HEENT: Negative; No changes in vision or hearing, sinus congestion,  difficulty swallowing Pulmonary: Negative; No cough, wheezing, shortness of breath, hemoptysis Cardiovascular: Negative; No chest pain, presyncope, syncope,  palpitations GI: Negative; No nausea, vomiting, diarrhea, or abdominal pain GU: Negative; No dysuria, hematuria, or difficulty voiding Musculoskeletal: Negative; no myalgias, joint pain, or weakness Hematologic/Oncology: Negative; no easy bruising, bleeding Endocrine: Negative; no heat/cold intolerance; no diabetes Neuro: Negative; no changes in balance, headaches Skin: Negative; No rashes or skin lesions Psychiatric: Negative; No behavioral problems, depression Sleep: Negative; No snoring, daytime sleepiness, hypersomnolence, bruxism, restless legs, hypnogognic hallucinations, no cataplexy Other comprehensive 14 point system review is negative.   PHYSICAL EXAM:   VS:  BP 136/64 (BP Location: Left Arm, Patient Position: Sitting, Cuff Size: Normal)   Pulse 61   Ht 5\' 5"  (1.651 m)   Wt 169 lb (76.7 kg)   SpO2 96%   BMI 28.12 kg/m     Repeat blood pressure by me was 126/68  Wt Readings from Last 3 Encounters:  04/04/23 169 lb (76.7 kg)  03/28/23 167 lb (75.8 kg)  01/04/23 166 lb (75.3 kg)    General: Alert, oriented, no distress.  Skin: normal turgor, no rashes, warm and dry HEENT: Normocephalic, atraumatic. Pupils equal round and reactive to light; sclera anicteric; extraocular muscles intact;  Nose without nasal septal hypertrophy Mouth/Parynx benign; Mallinpatti scale 3 Neck: No JVD, no carotid bruits; normal carotid upstroke Lungs: clear to ausculatation and percussion; no wheezing or rales Chest wall: without tenderness to palpitation Heart: PMI not displaced, RRR, s1 s2 normal, 2/6 systolic murmur in aortic area and left sternal border, no diastolic murmur, no rubs, gallops, thrills, or heaves Abdomen: soft, nontender; no hepatosplenomehaly, BS+; abdominal aorta nontender and not dilated by palpation. Back: no CVA tenderness Pulses 2+ Musculoskeletal: full range of motion, normal strength, no joint deformities Extremities: no clubbing cyanosis or edema, Homan's sign  negative  Neurologic: grossly nonfocal; Cranial nerves grossly wnl Psychologic: Normal mood and affect      Studies/Labs Reviewed:   EKG Interpretation Date/Time:  Tuesday April 04 2023 15:09:29 EST Ventricular Rate:  61 PR Interval:  172 QRS Duration:  80 QT Interval:  460 QTC Calculation: 463 R Axis:   -27  Text Interpretation: Normal sinus rhythm Normal ECG When compared with ECG of 28-Nov-2022 16:16, Premature atrial complexes are no longer Present Confirmed by Nicki Guadalajara ([id number]) on 04/04/2023 4:23:10 PM    Recent Labs:    Latest Ref Rng & Units 12/26/2022    9:19 AM 01/13/2021   10:15 AM  BMP  Glucose 70 - 99 mg/dL 96  604   BUN 8 - 27 mg/dL 32  25   Creatinine 5.40 - 1.00 mg/dL 9.81  1.91   BUN/Creat Ratio 12 - 28 20    Sodium 134 - 144 mmol/L 140  140   Potassium 3.5 - 5.2 mmol/L 4.8  4.4   Chloride 96 - 106 mmol/L 105  107   CO2 20 - 29 mmol/L 23  25   Calcium 8.7 - 10.3 mg/dL 47.8  9.7         Latest Ref Rng & Units 12/26/2022    9:19 AM 01/13/2021   10:15 AM  Hepatic Function  Total Protein 6.0 - 8.5 g/dL 7.0  6.8   Albumin 3.8 - 4.8 g/dL 4.4  4.0   AST 0 - 40 IU/L 33  28   ALT 0 - 32 IU/L 27  27   Alk Phosphatase 44 - 121 IU/L 71  69   Total  Bilirubin 0.0 - 1.2 mg/dL 0.4  0.4        Latest Ref Rng & Units 12/26/2022    9:19 AM 01/13/2021   10:15 AM  CBC  WBC 3.4 - 10.8 x10E3/uL 7.7  9.3   Hemoglobin 11.1 - 15.9 g/dL 16.1  09.6   Hematocrit 34.0 - 46.6 % 36.3  35.6   Platelets 150 - 450 x10E3/uL 345  334    Lab Results  Component Value Date   MCV 85 12/26/2022   MCV 88.1 01/13/2021   Lab Results  Component Value Date   TSH 5.540 (H) 12/26/2022   No results found for: "HGBA1C"   BNP No results found for: "BNP"  ProBNP No results found for: "PROBNP"   Lipid Panel     Component Value Date/Time   CHOL 188 12/26/2022 0919   TRIG 145 12/26/2022 0919   HDL 69 12/26/2022 0919   CHOLHDL 2.7 12/26/2022 0919   LDLCALC 94  12/26/2022 0919   LABVLDL 25 12/26/2022 0919     RADIOLOGY: XR Lumbar Spine 2-3 Views Result Date: 03/29/2023 AP lateral lumbar images are obtained and reviewed there is left lumbar curvature less than 20 degrees.  Anterolisthesis L5-S1 with degenerative facet changes grade 1.  Some aortic calcification noted. Impression: Negative for acute changes.  Degenerative anterolisthesis L5-S1.    Additional studies/ records that were reviewed today include:   EXAM:  09/14/2022 CT CARDIAC CORONARY ARTERY CALCIUM SCORE   TECHNIQUE: Non-contrast imaging through the heart was performed using prospective ECG gating. Image post processing was performed on an independent workstation, allowing for quantitative analysis of the heart and coronary arteries. Note that this exam targets the heart and the chest was not imaged in its entirety.   COMPARISON:  None available.   FINDINGS: CORONARY CALCIUM SCORES:   Left Main: 98   LAD: 109   LCx: 9   RCA: 6   Total Agatston Score: 222   MESA database percentile: 81st   AORTA MEASUREMENTS:   Ascending Aorta: 3.7 cm   Descending Aorta:2.5 cm   OTHER FINDINGS:   Atherosclerotic calcifications in the thoracic aorta. Calcifications of the aortic valve and mitral annulus. Within the visualized portions of the thorax there are no suspicious appearing pulmonary nodules or masses, there is no acute consolidative airspace disease, no pleural effusions, no pneumothorax and no lymphadenopathy. Visualized portions of the upper abdomen are unremarkable. There are no aggressive appearing lytic or blastic lesions noted in the visualized portions of the skeleton.   IMPRESSION: 1. Patient's total coronary artery calcium score is 222 which is 81st percentile for patient's of matched age, gender and race/ethnicity. Please note that although the presence of coronary artery calcium documents the presence of coronary artery disease, the severity of this  disease and any potential stenosis cannot be assessed on this noncontrast CT examination. Assessment for potential risk factor modification, dietary therapy or pharmacologic therapy may be warranted, if clinically indicated. 2.  Aortic Atherosclerosis (ICD10-I70.0). 3. There are calcifications of the aortic valve and mitral annulus. Echocardiographic correlation for evaluation of potential valvular dysfunction may be warranted if clinically indicated.   ECHO: 12/27/2022  1. Left ventricular ejection fraction, by estimation, is 55 to 60%. The  left ventricle has normal function. The left ventricle has no regional  wall motion abnormalities. Left ventricular diastolic parameters are  consistent with Grade I diastolic  dysfunction (impaired relaxation). Elevated left atrial pressure.   2. Right ventricular systolic function is normal. The right  ventricular  size is mildly enlarged. Tricuspid regurgitation signal is inadequate for  assessing PA pressure.   3. Left atrial size was moderately dilated.   4. The mitral valve is degenerative. Trivial mitral valve regurgitation.  Mild mitral stenosis. The mean mitral valve gradient is 3.8 mmHg with  average heart rate of 59 bpm. Severe mitral annular calcification.   5. The aortic valve is tricuspid. There is mild calcification of the  aortic valve. There is mild thickening of the aortic valve. Aortic valve  regurgitation is not visualized. Aortic valve sclerosis/calcification is  present, without any evidence of  aortic stenosis.    ASSESSMENT:    1. Agatston coronary artery calcium score between 200 and 399   2. Elevated Lp(a)   3. Hyperlipidemia with target LDL less than 50   4. Aortic valve sclerosis   5. 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 Premature atrial contractions     PLAN:  Ms. Meghann Landing is a 73 year old female who has a 20-year history of hypertension and has developed stage IIIb CKD followed in  [place] with recent serum creatinine at 1.68 and estimated GFR of 32.  She has family history for CAD with her father having CAD since age 7.  Most recently, she has been on a blood pressure regimen which includes nifedipine XL 60 mg twice a day, hydralazine 100 mg 3 times a day, and she takes furosemide 20 mg daily.  She is followed by Dr. Nilda Simmer in nephrology at [facility].  She has a history of hyperlipidemia.  She underwent a recent coronary calcium score which was elevated at 222 Agatston units with a majority of her calcification in the left main at 98, and LAD at 109 units.  At my initial evaluation, I recommended she increase atorvastatin to 80 mg.  I scheduled her to undergo subsequent laboratory including LP(a).  Laboratory done on December 26, 2022 showed total cholesterol 188 ,HDL 69, triglycerides 145 and LDL at 94.  LP(a) was markedly elevated at 404.8.  He was notified and told to start Zetia 10 mg in addition to her atorvastatin 80 mg.  She also had been advised to initiate aspirin 81 mg daily.  2D echo Doppler study revealed normal systolic function with EF 55 to 60%, grade 1 diastolic dysfunction.  The LA was moderately dilated and there was mildly elevated pressure.  There was evidence for significant mitral annular calcification with very mild mitral stenosis and aortic valve calcification with aortic sclerosis without stenosis.  She has tolerated her increased atorvastatin Zetia and aspirin regimen.  She has stage IIIb CKD with creatinine at 1.63.  Blood pressure today is stable on her current regimen.  She is not fasting today.  She will return to the office in the fasting state later this week and repeat chemistry and lipid studies will be obtained.  Target LDL is less than 50.  If she cannot reach target, with her markedly elevated LP(a), I will initiate Repatha may result in additional 26 to 30% LPA reduction.  I also discussed the current clinical trials  ongoing regarding siRNA agents.  Will contact her regarding her laboratory.  I discussed with her my plans for retirement later this year.  With her markedly elevated LP(a), I will transition her to the care of Dr. Italy Hilty who may be able to ultimately hold her in potential future trials with LPa reducing therapy.   Medication Adjustments/Labs and Tests Ordered: Current  medicines are reviewed at length with the patient today.  Concerns regarding medicines are outlined above.  Medication changes, Labs and Tests ordered today are listed in the Patient Instructions below. Patient Instructions  Medication Instructions:  No medication changes were made during today's visit  *If you need a refill on your cardiac medications before your next appointment, please call your pharmacy*   Lab Work: Return for fasting labs If you have labs (blood work) drawn today and your tests are completely normal, you will receive your results only by: MyChart Message (if you have MyChart) OR A paper copy in the mail If you have any lab test that is abnormal or we need to change your treatment, we will call you to review the results.   Testing/Procedures: No procedures ordered today.    Follow-Up: At [facility], you and your health needs are our priority.  As part of our continuing mission to provide you with exceptional heart care, we have created designated Provider Care Teams.  These Care Teams include your primary Cardiologist (physician) and Advanced Practice Providers (APPs -  Physician Assistants and Nurse Practitioners) who all work together to provide you with the care you need, when you need it.  We recommend signing up for the patient portal called "MyChart".  Sign up information is provided on this After Visit Summary.  MyChart is used to connect with patients for Virtual Visits (Telemedicine).  Patients are able to view lab/test results, encounter notes, upcoming appointments, etc.   Non-urgent messages can be sent to your provider as well.   To learn more about what you can do with MyChart, go to [email or web address].    Your next appointment:   6 month(s)  Provider:   Italy Hilty, MD     Other Instructions       If you have any questions or concerns regarding your c-pap, bi-pap or sleep accessories, please contact Brandie Rorie at [phone].           1st Floor: - Lobby - Registration  - Pharmacy  - Lab - Cafe   2nd Floor: - PV Lab - Diagnostic Testing (echo, CT, nuclear med)   3rd Floor: - Vacant   4th Floor: - TCTS (cardiothoracic surgery) - AFib Clinic - Structural Heart Clinic - Vascular Surgery  - Vascular Ultrasound   5th Floor: - HeartCare Cardiology (general and EP) - Clinical Pharmacy for coumadin, hypertension, lipid, weight-loss medications, and med management appointments      Valet parking services will be available as well.           Signed, Nicki Guadalajara, MD  04/04/2023 5:10 PM    [facility] [address], [address], [place], [place]  [address] Phone: [phone]

## 2023-04-05 LAB — COMPREHENSIVE METABOLIC PANEL
ALT: 22 IU/L (ref 0–32)
AST: 28 IU/L (ref 0–40)
Albumin: 4.5 g/dL (ref 3.8–4.8)
Alkaline Phosphatase: 75 IU/L (ref 44–121)
BUN/Creatinine Ratio: 21 (ref 12–28)
BUN: 33 mg/dL — ABNORMAL HIGH (ref 8–27)
Bilirubin Total: 0.4 mg/dL (ref 0.0–1.2)
CO2: 20 mmol/L (ref 20–29)
Calcium: 10 mg/dL (ref 8.7–10.3)
Chloride: 103 mmol/L (ref 96–106)
Creatinine, Ser: 1.57 mg/dL — ABNORMAL HIGH (ref 0.57–1.00)
Globulin, Total: 2.6 g/dL (ref 1.5–4.5)
Glucose: 93 mg/dL (ref 70–99)
Potassium: 5.4 mmol/L — ABNORMAL HIGH (ref 3.5–5.2)
Sodium: 141 mmol/L (ref 134–144)
Total Protein: 7.1 g/dL (ref 6.0–8.5)
eGFR: 35 mL/min/{1.73_m2} — ABNORMAL LOW (ref >59)

## 2023-04-05 LAB — LIPID PANEL
Chol/HDL Ratio: 2.7 ratio (ref 0.0–4.4)
Cholesterol, Total: 170 mg/dL (ref 100–199)
HDL: 64 mg/dL (ref >39)
LDL Chol Calc (NIH): 77 mg/dL (ref 0–99)
Triglycerides: 171 mg/dL — ABNORMAL HIGH (ref 0–149)
VLDL Cholesterol Cal: 29 mg/dL (ref 5–40)

## 2023-04-11 ENCOUNTER — Ambulatory Visit (INDEPENDENT_AMBULATORY_CARE_PROVIDER_SITE_OTHER): Payer: Medicare Other | Admitting: Rehabilitative and Restorative Service Providers"

## 2023-04-11 ENCOUNTER — Encounter: Payer: Self-pay | Admitting: Rehabilitative and Restorative Service Providers"

## 2023-04-11 DIAGNOSIS — M6281 Muscle weakness (generalized): Secondary | ICD-10-CM | POA: Diagnosis not present

## 2023-04-11 DIAGNOSIS — M5459 Other low back pain: Secondary | ICD-10-CM

## 2023-04-11 DIAGNOSIS — R262 Difficulty in walking, not elsewhere classified: Secondary | ICD-10-CM | POA: Diagnosis not present

## 2023-04-11 NOTE — Therapy (Cosign Needed)
 OUTPATIENT PHYSICAL THERAPY EVALUATION   Patient Name: Vanessa [name] MRN: [id number] DOB:1950-09-13, 73 y.o., female Today's Date: 04/12/2023  END OF SESSION:  PT End of Session - 04/11/23 1723     Visit Number 1    Number of Visits 10    Date for PT Re-Evaluation 06/20/23    Authorization Type Medicare    Progress Note Due on Visit 10    PT Start Time 1300    PT Stop Time 1343    PT Time Calculation (min) 43 min    Activity Tolerance Patient tolerated treatment well;No increased pain    Behavior During Therapy WFL for tasks assessed/performed             Past Medical History:  Diagnosis Date   Aortic valve sclerosis    Arthritis    osteoarthritis   Cancer (HCC)    endometrial   Chronic kidney disease    stage 3   Depression    GERD (gastroesophageal reflux disease)    Heart murmur    AV sclerosis; 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 but no obvious stenosis or regurgitation 12/04/17 echo ([facility])   Hepatitis    History of kidney stones    Hypertension    Hypothyroidism    PONV (postoperative nausea and vomiting)    Past Surgical History:  Procedure Laterality Date   ABDOMINAL HYSTERECTOMY  2008   BREAST SURGERY Right 06/20/2011   lumpectomy for "atypical ductal hyperplasia"   SHOULDER ARTHROSCOPY WITH ROTATOR CUFF REPAIR AND SUBACROMIAL DECOMPRESSION Right 01/29/2021   Procedure: RIGHT SHOULDER ARTHROSCOPY, DEBRIDEMENT, WITH MINI OPEN ROTATOR CUFF TEAR REPAIR & BICEPS TENODESIS;  Surgeon: Cammy Copa, MD;  Location: [facility] OR;  Service: Orthopedics;  Laterality: Right;   TONSILLECTOMY     TUBAL LIGATION     Patient Active Problem List   Diagnosis Date Noted   Complete tear of right rotator cuff    Biceps tendonitis on right    Degenerative superior labral anterior-to-posterior (SLAP) tear of right shoulder    Unilateral primary osteoarthritis, right knee 05/13/2020    PCP: Hix, Maebelle Munroe MD  REFERRING PROVIDER: Eldred Manges,  MD  REFERRING DIAG: M54.50 (ICD-10-CM) - Acute bilateral low back pain, unspecified whether sciatica present  Rationale for Evaluation and Treatment: Rehabilitation  THERAPY DIAG:  Other low back pain  Muscle weakness (generalized)  Difficulty in walking, not elsewhere classified  ONSET DATE: 1 month ago   SUBJECTIVE:  SUBJECTIVE STATEMENT: Worsening back pain that started about a month ago and has progressively gotten worse as time has gone on. Thinks that the pain came from when she was walking her dogs and they were pulling heavily on the leash which was putting more strain on her back to stay upright. Did take tylenol before session today so pain is lower than usual. 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 Ophelia Charter   OBJECTIVE:   DIAGNOSTIC FINDINGS:  AP lateral lumbar images are obtained and reviewed there is left lumbar  curvature less than 20 degrees.  Anterolisthesis L5-S1 with degenerative  facet changes grade 1.  Some aortic calcification noted.   Impression: Negative for acute changes.  Degenerative anterolisthesis  L5-S1.   PATIENT SURVEYS:  Patient-Specific Activity Scoring Scheme  "0" represents "unable to perform." "10" represents "able to perform at prior level. 0 1 2 3 4 5 6 7 8 9  10 (Date and Score)   Activity 04/11/2023    1. Long fast walk  3    2. Lifting heavy items   3    3. Playing golf 2   4. Walking up and down stairs 2   5. Standing for >31min 2   Score 2.4/10    Total score = sum of the activity scores/number of activities Minimum detectable change (90%CI) for average score = 2 points Minimum detectable change (90%CI) for single activity score = 3 points  SCREENING FOR RED FLAGS: 04/11/2023 Bowel or bladder incontinence: No Cauda equina syndrome: No  COGNITION: 04/11/2023 Overall cognitive status: WFL normal      SENSATION: 04/11/2023 [facility]  MUSCLE LENGTH: 04/11/2023 None tested  POSTURE:  04/11/2023 No Significant postural limitations  PALPATION: 04/11/2023 Tender to areas of the R glute/ piriformis area Non tender to lower back musculature   LUMBAR ROM:  04/11/2023 Directional Preference Assessment: Centralization: non specific Peripheralization: non specific  AROM 04/11/2023  Flexion Can reach toes without pain  Extension WFL  Right lateral flexion WFL  Left lateral flexion WFL  Right rotation 75%  Left rotation WFL   (Blank rows = not tested)  LOWER EXTREMITY ROM:      Right eval Left eval  Hip flexion    Hip  extension    Hip abduction    Hip adduction    Hip internal rotation  Hip external rotation    Knee flexion    Knee extension    Ankle dorsiflexion    Ankle plantarflexion    Ankle inversion    Ankle eversion     (Blank rows = not tested)  LOWER EXTREMITY MMT:    MMT Right 04/11/2023 Left 04/11/2023  Hip flexion    Hip extension 3- 3-  Hip abduction 4 5  Hip adduction    Hip internal rotation    Hip external rotation    Knee flexion    Knee extension    Ankle dorsiflexion    Ankle plantarflexion    Ankle inversion    Ankle eversion     (Blank rows = not tested)  LUMBAR SPECIAL TESTS:  04/11/2023 Not tested  FUNCTIONAL TESTS:  04/11/2023 Not tested  GAIT:  04/11/2023: ambulates without AD, no notable deviations                                                                                                                                                                                                                   TODAY'S TREATMENT:                                                                                                         DATE: 04/11/2023  Therex:    HEP instruction/performance c cues for techniques, handout provided.  Trial set performed of each for comprehension and symptom assessment.  See below for exercise list  PATIENT EDUCATION:  04/11/2023 Education details: HEP, POC Person educated: Patient Education method: Explanation, Demonstration, Verbal cues, and Handouts Education comprehension: verbalized understanding, returned demonstration, and verbal cues required  HOME EXERCISE PROGRAM: Access Code: [email or web address] URL: [email or web address] Date: 04/11/2023 Prepared by: Chyrel Masson   Exercises - Supine Figure 4 Piriformis Stretch  - 2-3 x daily - 7 x weekly - 1 sets - 3-5 reps - 20-30 hold - Supine Bridge  - 1-2 x daily - 7 x weekly - 1-2 sets - 10 reps - 2 hold - Prone Hip Extension with Pillow Under Abdomen  - 1-2  x daily - 7 x  weekly - 1-2 sets - 10-15 reps - Clamshell  - 1-2 x daily - 7 x weekly - 2-3 sets - 10 reps - Standing Lumbar Extension with Counter  - 2-3 x daily - 7 x weekly - 1 sets - 5-10 reps - Prone Press Up  - 1-2 x daily - 7 x weekly - 1-2 sets - 10 reps - 1-2 hold  ASSESSMENT:  CLINICAL IMPRESSION: Patient is a 73 y.o. who comes to clinic with complaints of right low back and glute pain with mobility, strength and movement coordination deficits that impair their ability to perform usual daily and recreational functional activities without increase difficulty/symptoms at this time.  Patient to benefit from skilled PT services to address impairments and limitations to improve to previous level of function without restriction secondary to condition.   OBJECTIVE IMPAIRMENTS: decreased activity tolerance, decreased balance, decreased endurance, decreased knowledge of condition, decreased mobility, difficulty walking, decreased strength, and pain.   ACTIVITY LIMITATIONS: carrying, lifting, bending, sitting, standing, squatting, stairs, and locomotion level  PARTICIPATION LIMITATIONS: meal prep, cleaning, laundry, driving, shopping, community activity, and yard work  PERSONAL FACTORS: Time since onset of injury/illness/exacerbation and 1-2 comorbidities: see above  are also affecting patient's functional outcome.   REHAB POTENTIAL: Good  CLINICAL DECISION MAKING: Stable/uncomplicated  EVALUATION COMPLEXITY: Low   GOALS: Goals reviewed with patient? Yes  SHORT TERM GOALS: (target date for Short term goals are 3 weeks 05/02/2023)  1. Patient will demonstrate independent use of home exercise program to maintain progress from in clinic treatments.  Goal status: New  LONG TERM GOALS: (target dates for all long term goals are 10 weeks  06/20/2023 )   1. Patient will demonstrate/report pain at worst less than or equal to 2/10 to facilitate minimal limitation in daily activity secondary to pain  symptoms.  Goal status: New   2. Patient will demonstrate independent use of home exercise program to facilitate ability to maintain/progress functional gains from skilled physical therapy services.  Goal status: New   3. Patient will demonstrate Patient specific functional scale avg > or = 8/10 to indicate reduced disability due to condition.   Goal status: New   4. Patient will demonstrate lumbar extension 100 % WFL s symptoms to facilitate upright standing, walking posture at PLOF s limitation.  Goal status: New   5.  Patient will demonstrate up and down a flight of stairs with single hand rail with reciprocal gait pattern s symptoms. Goal status: New   6.  Patient will demonstrate squatting to pick up moderate weight items without reproduction of pain Goal status: New   7.  patient will report walking long distances in the community with </=2/10 pain to return to PLOF  Goal Status: New  PLAN:  PT FREQUENCY: 1-2x/week  PT DURATION: 10 weeks  PLANNED INTERVENTIONS: Can include 40981- PT Re-evaluation, 97110-Therapeutic exercises, 97530- Therapeutic activity, 97112- Neuromuscular re-education, 97535- Self Care, 97140- Manual therapy, [id number]- Gait training, [phone]- Orthotic Fit/training, [phone]- Canalith repositioning, [id number]- Aquatic Therapy, G0283- Electrical stimulation (unattended), 97750 Physical performance testing, [id number]- Electrical stimulation (manual), 97016- Vasopneumatic device, [id number]- Ultrasound, [id number]- Traction (mechanical), [id number]- Ionotophoresis 4mg /ml Dexamethasone, Patient/Family education, Balance training, Stair training, Taping, Dry Needling, Joint mobilization, Joint manipulation, Spinal manipulation, Spinal mobilization, Scar mobilization, Vestibular training, Visual/preceptual remediation/compensation, DME instructions, Cryotherapy, and Moist heat.  All performed as medically necessary.  All included unless contraindicated  PLAN FOR NEXT SESSION: Review HEP  knowledge/results.  Possible manual mobilizations for  lumbar     Kourtney Terriquez, Markian Glockner, Student-PT 04/11/23  5:24 PM

## 2023-04-13 ENCOUNTER — Ambulatory Visit: Admitting: Rehabilitative and Restorative Service Providers"

## 2023-04-13 ENCOUNTER — Encounter: Payer: Self-pay | Admitting: Rehabilitative and Restorative Service Providers"

## 2023-04-13 DIAGNOSIS — M6281 Muscle weakness (generalized): Secondary | ICD-10-CM | POA: Diagnosis not present

## 2023-04-13 DIAGNOSIS — M5459 Other low back pain: Secondary | ICD-10-CM

## 2023-04-13 DIAGNOSIS — R262 Difficulty in walking, not elsewhere classified: Secondary | ICD-10-CM | POA: Diagnosis not present

## 2023-04-13 NOTE — Therapy (Signed)
 OUTPATIENT PHYSICAL THERAPY TREATMENT   Patient Name: Vanessa [name] MRN: [id number] DOB:[birth year], 72 y.o., female Today's Date: 04/13/2023  END OF SESSION:  PT End of Session - 04/13/23 1153     Visit Number 2    Number of Visits 10    Date for PT Re-Evaluation 06/20/23    Authorization Type Medicare    Progress Note Due on Visit 10    PT Start Time 1150    PT Stop Time 1228    PT Time Calculation (min) 38 min    Activity Tolerance Patient tolerated treatment well    Behavior During Therapy WFL for tasks assessed/performed              Past Medical History:  Diagnosis Date   Aortic valve sclerosis    Arthritis    osteoarthritis   Cancer (HCC)    endometrial   Chronic kidney disease    stage 3   Depression    GERD (gastroesophageal reflux disease)    Heart murmur    AV sclerosis; 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 but no obvious stenosis or regurgitation 12/04/17 echo ([facility])   Hepatitis    History of kidney stones    Hypertension    Hypothyroidism    PONV (postoperative nausea and vomiting)    Past Surgical History:  Procedure Laterality Date   ABDOMINAL HYSTERECTOMY  2008   BREAST SURGERY Right 06/20/2011   lumpectomy for "atypical ductal hyperplasia"   SHOULDER ARTHROSCOPY WITH ROTATOR CUFF REPAIR AND SUBACROMIAL DECOMPRESSION Right 01/29/2021   Procedure: RIGHT SHOULDER ARTHROSCOPY, DEBRIDEMENT, WITH MINI OPEN ROTATOR CUFF TEAR REPAIR & BICEPS TENODESIS;  Surgeon: Cammy Copa, MD;  Location: [facility] OR;  Service: Orthopedics;  Laterality: Right;   TONSILLECTOMY     TUBAL LIGATION     Patient Active Problem List   Diagnosis Date Noted   Complete tear of right rotator cuff    Biceps tendonitis on right    Degenerative superior labral anterior-to-posterior (SLAP) tear of right shoulder    Unilateral primary osteoarthritis, right knee 05/13/2020    PCP: Hix, Maebelle Munroe MD  REFERRING PROVIDER: Eldred Manges, MD  REFERRING DIAG: M54.50  (ICD-10-CM) - Acute bilateral low back pain, unspecified whether sciatica present  Rationale for Evaluation and Treatment: Rehabilitation  THERAPY DIAG:  Other low back pain  Muscle weakness (generalized)  Difficulty in walking, not elsewhere classified  ONSET DATE: 1 month ago   SUBJECTIVE:  SUBJECTIVE STATEMENT: Pt indicated good results with HEP.  Walking dog for about 15 mins and limited by pain.  Reported having some complaints this morning but reduced some.   PERTINENT HISTORY:  Medical Hx: OA, Endometrial cancer, depression, GERD, Hepatitis, HTN, hypothyroidism  PAIN:  NPRS scale:  up to 7/10 with walking yesterday.   Pain location: rt side of low back and into buttock Pain description: sharp, "catching pain", achy, "always there" Aggravating factors: Going up and down stairs, standing for long periods of time Relieving factors: sitting down, pain medicine- asprin/tylenol   PRECAUTIONS: None  WEIGHT BEARING RESTRICTIONS: No  FALLS:  Has patient fallen in last 6 months? 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 Ophelia Charter   OBJECTIVE:   DIAGNOSTIC FINDINGS:  AP lateral lumbar images are obtained and reviewed there is left lumbar  curvature less than 20 degrees.  Anterolisthesis L5-S1 with degenerative  facet changes  grade 1.  Some aortic calcification noted.   Impression: Negative for acute changes.  Degenerative anterolisthesis  L5-S1.   PATIENT SURVEYS:  Patient-Specific Activity Scoring Scheme  "0" represents "unable to perform." "10" represents "able to perform at prior level. 0 1 2 3 4 5 6 7 8 9  10 (Date and Score)   Activity 04/11/2023    1. Long fast walk  3    2. Lifting heavy items   3    3. Playing golf 2   4. Walking up and down stairs 2   5. Standing for >12min 2   Score 2.4/10    Total score = sum of the activity scores/number of activities Minimum detectable change (90%CI) for average score = 2 points Minimum detectable change (90%CI) for single activity score = 3 points  SCREENING FOR RED FLAGS: 04/11/2023 Bowel or bladder incontinence: No Cauda equina syndrome: No  COGNITION: 04/11/2023 Overall cognitive status: WFL normal      SENSATION: 04/11/2023 [facility]  MUSCLE LENGTH: 04/11/2023 None tested  POSTURE:  04/11/2023 No Significant postural limitations  PALPATION: 04/11/2023 Tender to areas of the R glute/ piriformis area Non tender to lower back musculature   LUMBAR ROM:  04/11/2023 Directional Preference Assessment: Centralization: non specific Peripheralization: non specific  AROM 04/11/2023  Flexion Can reach toes without pain  Extension WFL  Right lateral flexion WFL  Left lateral flexion WFL  Right rotation 75%  Left rotation WFL   (Blank rows = not tested)  LOWER EXTREMITY ROM:      Right eval Left eval  Hip flexion    Hip extension    Hip abduction    Hip adduction    Hip internal rotation    Hip external rotation    Knee flexion    Knee extension    Ankle dorsiflexion    Ankle plantarflexion    Ankle inversion    Ankle eversion     (Blank rows = not tested)  LOWER EXTREMITY MMT:    MMT Right 04/11/2023 Left 04/11/2023  Hip flexion    Hip extension 3- 3-  Hip abduction  4 5  Hip adduction    Hip internal rotation    Hip external rotation     Knee flexion    Knee extension    Ankle dorsiflexion    Ankle plantarflexion    Ankle inversion    Ankle eversion     (Blank rows = not tested)  LUMBAR SPECIAL TESTS:  04/11/2023 Not tested  FUNCTIONAL TESTS:  04/11/2023 Not tested  GAIT:  04/11/2023: ambulates without AD, no notable deviations                                                                                                                                                                                                                   TODAY'S TREATMENT:                                                                                                         DATE: 04/13/2023  Therex: Lt sidelying regional rotation self stretch 15 sec x 4 Prone opposite arm/leg lift 3 sec hold x 10 bilateral  Supine bridge c tband hip abduction blue band 2 x 10 Supine hooklying clam shell with isometric hold opposite leg blue band x 20 bilateral  Supine pball press down 10 sec x 10 for ab activation Seated multifidi activation UE lift into table 10 sec hold x 10 Standing lumbar extension AROM x 10 total  Review of existing HEP and updates.    Manual: Lt sidelying regional rotation lumbar g4 mobs with cavitation noted taking up slack.  Percussive device to Rt QL, paraspinals in Lt sidelying.   TODAY'S TREATMENT:  DATE: 04/11/2023  Therex:    HEP instruction/performance c cues for techniques, handout provided.  Trial set performed of each for comprehension and symptom assessment.  See below for exercise list  PATIENT EDUCATION:  04/11/2023 Education details: HEP, POC Person educated: Patient Education method: Explanation, Demonstration, Verbal cues, and Handouts Education comprehension: verbalized understanding, returned demonstration, and verbal cues required  HOME EXERCISE PROGRAM: Access Code: [email or web address] URL:  [email or web address] Date: 04/13/2023 Prepared by: Chyrel Masson  Exercises - Supine Figure 4 Piriformis Stretch  - 2-3 x daily - 7 x weekly - 1 sets - 3-5 reps - 20-30 hold - Supine Bridge  - 1-2 x daily - 7 x weekly - 1-2 sets - 10 reps - 2 hold - Clamshell  - 1-2 x daily - 7 x weekly - 2-3 sets - 10 reps - Standing Lumbar Extension with Counter  - 2-3 x daily - 7 x weekly - 1 sets - 5-10 reps - Prone Press Up  - 1-2 x daily - 7 x weekly - 1-2 sets - 10 reps - 1-2 hold - Prone Alternating Arm and Leg Lifts  - 1-2 x daily - 7 x weekly - 1-2 sets - 10 reps - 3 hold - Seated Multifidi Isometric  - 1-2 x daily - 7 x weekly - 1 sets - 10 reps - 5-10 hold - Sidelying Lumbar Rotation Stretch  - 2-3 x daily - 7 x weekly - 1 sets - 5 reps - 15 hold  ASSESSMENT:  CLINICAL IMPRESSION: Rt sided lumbar symptom presentation today seemed to include myofascial complaints as well as some joint mobility restriction as observed in intervention.  Positive signs from use of HEP with good overall recall.  Continued trouble with prolonged walking activity.  Continued skilled PT services indicated at this time.   OBJECTIVE IMPAIRMENTS: decreased activity tolerance, decreased balance, decreased endurance, decreased knowledge of condition, decreased mobility, difficulty walking, decreased strength, and pain.   ACTIVITY LIMITATIONS: carrying, lifting, bending, sitting, standing, squatting, stairs, and locomotion level  PARTICIPATION LIMITATIONS: meal prep, cleaning, laundry, driving, shopping, community activity, and yard work  PERSONAL FACTORS: Time since onset of injury/illness/exacerbation and 1-2 comorbidities: see above  are also affecting patient's functional outcome.   REHAB POTENTIAL: Good  CLINICAL DECISION MAKING: Stable/uncomplicated  EVALUATION COMPLEXITY: Low   GOALS: Goals reviewed with patient? Yes  SHORT TERM GOALS: (target date for Short term goals are 3 weeks  05/02/2023)  1. Patient will demonstrate independent use of home exercise program to maintain progress from in clinic treatments.  Goal status: on going 04/13/2023  LONG TERM GOALS: (target dates for all long term goals are 10 weeks  06/20/2023 )   1. Patient will demonstrate/report pain at worst less than or equal to 2/10 to facilitate minimal limitation in daily activity secondary to pain symptoms.  Goal status: New   2. Patient will demonstrate independent use of home exercise program to facilitate ability to maintain/progress functional gains from skilled physical therapy services.  Goal status: New   3. Patient will demonstrate Patient specific functional scale avg > or = 8/10 to indicate reduced disability due to condition.   Goal status: New   4. Patient will demonstrate lumbar extension 100 % WFL s symptoms to facilitate upright standing, walking posture at PLOF s limitation.  Goal status: New   5.  Patient will demonstrate up and down a flight of stairs with single hand rail with reciprocal gait pattern s symptoms. Goal status: New   6.  Patient will demonstrate squatting to pick up moderate weight items without reproduction of pain Goal status: New   7.  patient will report walking long distances in the community with </=2/10 pain to return to PLOF  Goal Status: New  PLAN:  PT FREQUENCY: 1-2x/week  PT DURATION: 10 weeks  PLANNED INTERVENTIONS: Can include 16109- PT Re-evaluation, 97110-Therapeutic exercises, 97530- Therapeutic activity, 97112- Neuromuscular re-education, 97535- Self Care, 97140- Manual therapy, [id number]- Gait training, [phone]- Orthotic Fit/training, [phone]- Canalith repositioning, [id number]- Aquatic Therapy, G0283- Electrical stimulation (unattended), 97750 Physical performance testing, [id number]- Electrical stimulation (manual), 97016- Vasopneumatic device, [id number]- Ultrasound, [id number]- Traction (mechanical), [id number]- Ionotophoresis 4mg /ml Dexamethasone, Patient/Family  education, Balance training, Stair training, Taping, Dry Needling, Joint mobilization, Joint manipulation, Spinal manipulation, Spinal mobilization, Scar mobilization, Vestibular training, Visual/preceptual remediation/compensation, DME instructions, Cryotherapy, and Moist heat.  All performed as medically necessary.  All included unless contraindicated  PLAN FOR NEXT SESSION: Recheck range, continue postural /hip strengthening.    Chyrel Masson, PT, DPT, OCS, ATC 04/13/23  12:29 PM

## 2023-04-19 ENCOUNTER — Encounter: Payer: Self-pay | Admitting: Rehabilitative and Restorative Service Providers"

## 2023-04-19 ENCOUNTER — Ambulatory Visit (INDEPENDENT_AMBULATORY_CARE_PROVIDER_SITE_OTHER): Payer: Self-pay | Admitting: Rehabilitative and Restorative Service Providers"

## 2023-04-19 DIAGNOSIS — M6281 Muscle weakness (generalized): Secondary | ICD-10-CM | POA: Diagnosis not present

## 2023-04-19 DIAGNOSIS — R262 Difficulty in walking, not elsewhere classified: Secondary | ICD-10-CM | POA: Diagnosis not present

## 2023-04-19 DIAGNOSIS — M5459 Other low back pain: Secondary | ICD-10-CM | POA: Diagnosis not present

## 2023-04-19 NOTE — Therapy (Addendum)
 OUTPATIENT PHYSICAL THERAPY TREATMENT   Patient Name: Vanessa [name] MRN: [id number] DOB:1950-11-10, 73 y.o., female Today's Date: 04/19/2023  END OF SESSION:  PT End of Session - 04/19/23 1431     Visit Number 3    Number of Visits 10    Date for PT Re-Evaluation 06/20/23    Authorization Type Medicare    Progress Note Due on Visit 10    PT Start Time 1430    PT Stop Time 1513    PT Time Calculation (min) 43 min    Activity Tolerance Patient tolerated treatment well    Behavior During Therapy WFL for tasks assessed/performed              Past Medical History:  Diagnosis Date   Aortic valve sclerosis    Arthritis    osteoarthritis   Cancer (HCC)    endometrial   Chronic kidney disease    stage 3   Depression    GERD (gastroesophageal reflux disease)    Heart murmur    AV sclerosis; 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 but no obvious stenosis or regurgitation 12/04/17 echo ([facility])   Hepatitis    History of kidney stones    Hypertension    Hypothyroidism    PONV (postoperative nausea and vomiting)    Past Surgical History:  Procedure Laterality Date   ABDOMINAL HYSTERECTOMY  2008   BREAST SURGERY Right 06/20/2011   lumpectomy for "atypical ductal hyperplasia"   SHOULDER ARTHROSCOPY WITH ROTATOR CUFF REPAIR AND SUBACROMIAL DECOMPRESSION Right 01/29/2021   Procedure: RIGHT SHOULDER ARTHROSCOPY, DEBRIDEMENT, WITH MINI OPEN ROTATOR CUFF TEAR REPAIR & BICEPS TENODESIS;  Surgeon: Cammy Copa, MD;  Location: [facility] OR;  Service: Orthopedics;  Laterality: Right;   TONSILLECTOMY     TUBAL LIGATION     Patient Active Problem List   Diagnosis Date Noted   Complete tear of right rotator cuff    Biceps tendonitis on right    Degenerative superior labral anterior-to-posterior (SLAP) tear of right shoulder    Unilateral primary osteoarthritis, right knee 05/13/2020    PCP: Hix, Maebelle Munroe MD  REFERRING PROVIDER: Eldred Manges, MD  REFERRING DIAG: M54.50  (ICD-10-CM) - Acute bilateral low back pain, unspecified whether sciatica present  Rationale for Evaluation and Treatment: Rehabilitation  THERAPY DIAG:  Other low back pain  Muscle weakness (generalized)  Difficulty in walking, not elsewhere classified  ONSET DATE: 1 month ago   SUBJECTIVE:  SUBJECTIVE STATEMENT: Patient states she does not think last session helped much in regards to symptoms. States that she has felt pain when getting up in the morning especially with the first few steps she takes.    PERTINENT HISTORY:  Medical Hx: OA, Endometrial cancer, depression, GERD, Hepatitis, HTN, hypothyroidism  PAIN:  NPRS scale:  4/10 upon arrival   Pain location: rt side of low back and into buttock Pain description: sharp, "catching pain", achy, "always there" Aggravating factors: Going up and down stairs, standing for long periods of time Relieving factors: sitting down, pain medicine- asprin/tylenol   PRECAUTIONS: None  WEIGHT BEARING RESTRICTIONS: No  FALLS:  Has patient fallen in last 6 months? 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 Ophelia Charter   OBJECTIVE:   DIAGNOSTIC FINDINGS:  AP lateral lumbar images are obtained and reviewed there is left lumbar  curvature less than 20 degrees.  Anterolisthesis L5-S1 with  degenerative  facet changes grade 1.  Some aortic calcification noted.   Impression: Negative for acute changes.  Degenerative anterolisthesis  L5-S1.   PATIENT SURVEYS:  Patient-Specific Activity Scoring Scheme  "0" represents "unable to perform." "10" represents "able to perform at prior level. 0 1 2 3 4 5 6 7 8 9  10 (Date and Score)   Activity 04/11/2023   1. Long fast walk  3    2. Lifting heavy items   3    3. Playing golf 2   4. Walking up and down stairs 2   5. Standing for >32min 2   Score 2.4/10    Total score = sum of the activity scores/number of activities Minimum detectable change (90%CI) for average score = 2 points Minimum detectable change (90%CI) for single activity score = 3 points  SCREENING FOR RED FLAGS: 04/11/2023 Bowel or bladder incontinence: No Cauda equina syndrome: No  COGNITION: 04/11/2023 Overall cognitive status: WFL normal      SENSATION: 04/11/2023 [facility]  MUSCLE LENGTH: 04/11/2023 None tested  POSTURE:  04/11/2023 No Significant postural limitations  PALPATION: 04/19/2023: Trigger points with concordant localized symptoms Rt glute max/med  04/11/2023 Tender to areas of the R glute/ piriformis area Non tender to lower back musculature   LUMBAR ROM:  04/11/2023 Directional Preference Assessment: Centralization: non specific Peripheralization: non specific  AROM 04/11/2023 04/19/2023  Flexion Can reach toes without pain   Extension WFL 75% today  Right lateral flexion WFL   Left lateral flexion WFL   Right rotation 75%   Left rotation WFL    (Blank rows = not tested)  LOWER EXTREMITY ROM:      Right eval Left eval  Hip flexion    Hip extension    Hip abduction    Hip adduction    Hip internal rotation    Hip external rotation    Knee flexion    Knee extension    Ankle dorsiflexion    Ankle plantarflexion    Ankle inversion    Ankle eversion     (Blank rows = not tested)  LOWER  EXTREMITY MMT:    MMT Right 04/11/2023  Left 04/11/2023  Hip flexion    Hip extension 3- 3-  Hip abduction 4 5  Hip adduction    Hip internal rotation    Hip external rotation    Knee flexion    Knee extension    Ankle dorsiflexion    Ankle plantarflexion    Ankle inversion    Ankle eversion     (Blank rows = not tested)  LUMBAR SPECIAL TESTS:  04/11/2023 Not tested  FUNCTIONAL TESTS:  04/11/2023 Not tested  GAIT:  04/11/2023: ambulates without AD, no notable deviations                                                                                                                                                                                                                   TODAY'S TREATMENT:                                                                                                         DATE: 04/19/2023  Therex: Lt sidelying regional rotation self stretch 4x15sec hold Figure 4x15sec ea Supine bridge with blue tband hip abduction 2x10 Supine hooklying clam shell with isometric hold opposite leg blue tband x 20 bilateral  Standing lumbar extension AROM x10   Manual: Percussive device to Rt QL and glute to patient tolerance  Self Care Education verbally on post manual and needling soreness possibility and effective strategy to help address and improve following visit.  Strategies included but not limited to:  heat/ice prn, increased water intake, use of HEP and general mobility to move muscle soreness out.  Pt voiced understanding.    Trigger Point Dry Needling Initial Treatment: Pt instructed on Dry Needling rational, procedures, and possible side effects. Pt instructed to expect mild to moderate muscle soreness later in the day and/or into the next day.  Pt instructed in methods to reduce muscle soreness. Pt instructed to continue prescribed HEP. Patient verbalized understanding of these instructions and education.  Patient Verbal Consent Given: Yes Education Handout Provided: Yes Muscles treated:  Rt  glute max Treatment response/outcome: local twitch response    TREATMENT:                                                                                                         DATE: 04/13/2023  Therex: Lt sidelying regional rotation self stretch 15 sec x 4 Prone opposite arm/leg lift 3 sec hold x 10 bilateral  Supine bridge c tband hip abduction blue band 2 x 10 Supine hooklying clam shell with isometric hold opposite leg blue band x 20 bilateral  Supine pball press down 10 sec x 10 for ab activation Seated multifidi activation UE lift into table 10 sec hold x 10 Standing lumbar extension AROM x 10 total  Review of existing HEP and updates.    Manual: Lt sidelying regional rotation lumbar g4 mobs with cavitation noted taking up slack.  Percussive device to Rt QL, paraspinals in Lt sidelying.   TODAY'S TREATMENT:                                                                                                         DATE: 04/11/2023  Therex:    HEP instruction/performance c cues for techniques, handout provided.  Trial set performed of each for comprehension and symptom assessment.  See below for exercise list  PATIENT EDUCATION:  04/19/2023 Education details:DN Person educated: Patient Education method: Programmer, multimedia, Facilities manager, Verbal cues, and Handouts Education comprehension: verbalized understanding, returned demonstration, and verbal cues required  HOME EXERCISE PROGRAM: Access Code: [email or web address] URL: [email or web address] Date: 04/13/2023 Prepared by: Chyrel Masson  Exercises - Supine Figure 4 Piriformis Stretch  - 2-3 x daily - 7 x weekly - 1 sets - 3-5 reps - 20-30 hold - Supine Bridge  - 1-2 x daily - 7 x weekly - 1-2 sets - 10 reps - 2 hold - Clamshell  - 1-2 x daily - 7 x weekly - 2-3 sets - 10 reps - Standing Lumbar Extension with Counter  - 2-3 x daily - 7 x weekly - 1 sets - 5-10 reps - Prone Press Up  - 1-2 x daily - 7 x weekly - 1-2 sets - 10 reps -  1-2 hold - Prone Alternating Arm and Leg Lifts  - 1-2 x daily - 7 x weekly - 1-2 sets - 10 reps - 3 hold - Seated Multifidi Isometric  - 1-2 x daily - 7 x weekly - 1 sets - 10 reps - 5-10 hold - Sidelying Lumbar Rotation Stretch  - 2-3 x daily - 7 x weekly - 1 sets - 5 reps - 15 hold  ASSESSMENT:  CLINICAL IMPRESSION: Patient did  well with activity and tolerated first session dry needling. At arrival patient was c/o pain especailly with movement after being sedentary, at end of session patient reports decreased pain and demonstrated bouts of activity tolerance. Patient will benefit from continued skilled PT to address deficits, mobility, and pain.    OBJECTIVE IMPAIRMENTS: decreased activity tolerance, decreased balance, decreased endurance, decreased knowledge of condition, decreased mobility, difficulty walking, decreased strength, and pain.   ACTIVITY LIMITATIONS: carrying, lifting, bending, sitting, standing, squatting, stairs, and locomotion level  PARTICIPATION LIMITATIONS: meal prep, cleaning, laundry, driving, shopping, community activity, and yard work  PERSONAL FACTORS: Time since onset of injury/illness/exacerbation and 1-2 comorbidities: see above  are also affecting patient's functional outcome.   REHAB POTENTIAL: Good  CLINICAL DECISION MAKING: Stable/uncomplicated  EVALUATION COMPLEXITY: Low   GOALS: Goals reviewed with patient? Yes  SHORT TERM GOALS: (target date for Short term goals are 3 weeks 05/02/2023)  1. Patient will demonstrate independent use of home exercise program to maintain progress from in clinic treatments.  Goal status: on going 04/13/2023  LONG TERM GOALS: (target dates for all long term goals are 10 weeks  06/20/2023 )   1. Patient will demonstrate/report pain at worst less than or equal to 2/10 to facilitate minimal limitation in daily activity secondary to pain symptoms.  Goal status: New   2. Patient will demonstrate independent use of home  exercise program to facilitate ability to maintain/progress functional gains from skilled physical therapy services.  Goal status: New   3. Patient will demonstrate Patient specific functional scale avg > or = 8/10 to indicate reduced disability due to condition.   Goal status: New   4. Patient will demonstrate lumbar extension 100 % WFL s symptoms to facilitate upright standing, walking posture at PLOF s limitation.  Goal status: New   5.  Patient will demonstrate up and down a flight of stairs with single hand rail with reciprocal gait pattern s symptoms. Goal status: New   6.  Patient will demonstrate squatting to pick up moderate weight items without reproduction of pain Goal status: New   7.  patient will report walking long distances in the community with </=2/10 pain to return to PLOF  Goal Status: New  PLAN:  PT FREQUENCY: 1-2x/week  PT DURATION: 10 weeks  PLANNED INTERVENTIONS: Can include 95621- PT Re-evaluation, 97110-Therapeutic exercises, 97530- Therapeutic activity, 97112- Neuromuscular re-education, 97535- Self Care, 97140- Manual therapy, [id number]- Gait training, [phone]- Orthotic Fit/training, [phone]- Canalith repositioning, [id number]- Aquatic Therapy, G0283- Electrical stimulation (unattended), 97750 Physical performance testing, [id number]- Electrical stimulation (manual), 97016- Vasopneumatic device, [id number]- Ultrasound, [id number]- Traction (mechanical), [id number]- Ionotophoresis 4mg /ml Dexamethasone, Patient/Family education, Balance training, Stair training, Taping, Dry Needling, Joint mobilization, Joint manipulation, Spinal manipulation, Spinal mobilization, Scar mobilization, Vestibular training, Visual/preceptual remediation/compensation, DME instructions, Cryotherapy, and Moist heat.  All performed as medically necessary.  All included unless contraindicated  PLAN FOR NEXT SESSION: continue dry needling if patient dictates it went well, hip strengthening and overall LE strengthening,  check ROM.    Tyrea Froberg, Brave Dack, Student-PT 04/19/2023, 3:35 PM

## 2023-04-19 NOTE — Patient Instructions (Signed)

## 2023-04-25 ENCOUNTER — Encounter: Payer: Self-pay | Admitting: Rehabilitative and Restorative Service Providers"

## 2023-04-25 ENCOUNTER — Ambulatory Visit (INDEPENDENT_AMBULATORY_CARE_PROVIDER_SITE_OTHER): Admitting: Rehabilitative and Restorative Service Providers"

## 2023-04-25 ENCOUNTER — Ambulatory Visit (INDEPENDENT_AMBULATORY_CARE_PROVIDER_SITE_OTHER): Payer: Medicare Other | Admitting: Orthopaedic Surgery

## 2023-04-25 ENCOUNTER — Encounter: Payer: Self-pay | Admitting: Orthopaedic Surgery

## 2023-04-25 VITALS — BP 134/72 | HR 63

## 2023-04-25 DIAGNOSIS — M6281 Muscle weakness (generalized): Secondary | ICD-10-CM | POA: Diagnosis not present

## 2023-04-25 DIAGNOSIS — R262 Difficulty in walking, not elsewhere classified: Secondary | ICD-10-CM

## 2023-04-25 DIAGNOSIS — M48061 Spinal stenosis, lumbar region without neurogenic claudication: Secondary | ICD-10-CM | POA: Insufficient documentation

## 2023-04-25 DIAGNOSIS — M545 Low back pain, unspecified: Secondary | ICD-10-CM | POA: Diagnosis not present

## 2023-04-25 DIAGNOSIS — M4310 Spondylolisthesis, site unspecified: Secondary | ICD-10-CM | POA: Diagnosis not present

## 2023-04-25 DIAGNOSIS — M5459 Other low back pain: Secondary | ICD-10-CM | POA: Diagnosis not present

## 2023-04-25 NOTE — Progress Notes (Signed)
 Office Visit Note   Patient: Vanessa [name]           Date of Birth: 1950/07/31           MRN: [id number] Visit Date: 04/25/2023              Requested by: Roxy Manns, MD No address on file PCP: Hix, Maebelle Munroe, MD   Assessment & Plan: Visit Diagnoses:  1. Acute bilateral low back pain, unspecified whether sciatica present   2. Degenerative spondylolisthesis   3. Neural foraminal stenosis of lumbar spine     Plan: Patient has not responded to medications physical therapy with persistent symptoms.  She has grade 1 spondylolisthesis L5-S1 with right-sided symptoms.  Will proceed with MRI scan to evaluate her for foraminal stenosis L5-S1 and she can follow-up with Dr. Christell Constant.  She may be a good candidate post MRI for an epidural steroid injection.  Follow-Up Instructions: No follow-ups on file.   Orders:  Orders Placed This Encounter  Procedures   MR Lumbar Spine w/o contrast   No orders of the defined types were placed in this encounter.     Procedures: No procedures performed   Clinical Data: No additional findings.   Subjective: Chief Complaint  Patient presents with   Lower Back - Pain, Follow-up    HPI 73 year old female returns 5 weeks post physical therapy for ongoing problems with degenerative spondylolisthesis L5-S1 with some right lumbar scoliosis and right foraminal stenosis.  She states therapy has not eased off her symptoms she has trouble when she walks she walks with a limp at times she has to stop leaning over or sit and is concerned she will make it home.  This limited her community ambulation no chills or fever no bowel bladder symptoms.  Review of Systems remote history of ACL reconstruction 15+ years ago with some patellofemoral degenerative changes.   Objective: Vital Signs: BP 134/72   Pulse 63   Physical Exam Constitutional:      Appearance: She is well-developed.  HENT:     Head: Normocephalic.     Right Ear: External  ear normal.     Left Ear: External ear normal. There is no impacted cerumen.  Eyes:     Pupils: Pupils are equal, round, and reactive to light.  Neck:     Thyroid: No thyromegaly.     Trachea: No tracheal deviation.  Cardiovascular:     Rate and Rhythm: Normal rate.  Pulmonary:     Effort: Pulmonary effort is normal.  Abdominal:     Palpations: Abdomen is soft.  Musculoskeletal:     Cervical back: No rigidity.  Skin:    General: Skin is warm and dry.  Neurological:     Mental Status: She is alert and oriented to person, place, and time.  Psychiatric:        Behavior: Behavior normal.     Ortho Exam sciatic notch tenderness on the right.  Positive straight leg raising 90 degrees on the right some pain with popliteal compression.Healed ACL incisions right knee.  Reflexes are intact.  Specialty Comments:  No specialty comments available.  Imaging: No results found.   PMFS History: Patient Active Problem List   Diagnosis Date Noted   Degenerative spondylolisthesis 04/25/2023   Neural foraminal stenosis of lumbar spine 04/25/2023   Complete tear of right rotator cuff    Biceps tendonitis on right    Degenerative superior labral anterior-to-posterior (SLAP) tear of right shoulder  Unilateral primary osteoarthritis, right knee 05/13/2020   Past Medical History:  Diagnosis Date   Aortic valve sclerosis    Arthritis    osteoarthritis   Cancer (HCC)    endometrial   Chronic kidney disease    stage 3   Depression    GERD (gastroesophageal reflux disease)    Heart murmur    AV sclerosis; 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  Past Surgical History:  Procedure Laterality Date   ABDOMINAL HYSTERECTOMY  2008   BREAST SURGERY Right 06/20/2011   lumpectomy for  "atypical ductal hyperplasia"   SHOULDER ARTHROSCOPY WITH ROTATOR CUFF REPAIR AND SUBACROMIAL DECOMPRESSION Right 01/29/2021   Procedure: RIGHT SHOULDER ARTHROSCOPY, DEBRIDEMENT, WITH MINI OPEN ROTATOR CUFF TEAR REPAIR & BICEPS TENODESIS;  Surgeon: Cammy Copa, MD;  Location: [facility] OR;  Service: Orthopedics;  Laterality: Right;   TONSILLECTOMY     TUBAL LIGATION     Social History   Occupational History   Not on file  Tobacco Use   Smoking status: Former    Types: Cigarettes   Smokeless tobacco: Never  Vaping Use   Vaping status: Never Used  Substance and Sexual Activity   Alcohol use: Yes    Comment: 4-5 times week - 1 drink   Drug use: Never   Sexual activity: Not on file

## 2023-04-25 NOTE — Therapy (Signed)
 OUTPATIENT PHYSICAL THERAPY TREATMENT   Vanessa [name] Name: Vanessa [name] MRN: [id number] DOB:[birth year], 73 y.o., female Today's Date: 04/25/2023  END OF SESSION:  PT End of Session - 04/25/23 1135     Visit Number 4    Number of Visits 10    Date for PT Re-Evaluation 06/20/23    Authorization Type Medicare    Progress Note Due on Visit 10    PT Start Time 1141    PT Stop Time 1222    PT Time Calculation (min) 41 min    Activity Tolerance Vanessa [name] tolerated treatment well    Behavior During Therapy WFL for tasks assessed/performed               Past Medical History:  Diagnosis Date   Aortic valve sclerosis    Arthritis    osteoarthritis   Cancer (HCC)    endometrial   Chronic kidney disease    stage 3   Depression    GERD (gastroesophageal reflux disease)    Heart murmur    AV sclerosis; 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 but no obvious stenosis or regurgitation 12/04/17 echo ([facility])   Hepatitis    History of kidney stones    Hypertension    Hypothyroidism    PONV (postoperative nausea and vomiting)    Past Surgical History:  Procedure Laterality Date   ABDOMINAL HYSTERECTOMY  2008   BREAST SURGERY Right 06/20/2011   lumpectomy for "atypical ductal hyperplasia"   SHOULDER ARTHROSCOPY WITH ROTATOR CUFF REPAIR AND SUBACROMIAL DECOMPRESSION Right 01/29/2021   Procedure: RIGHT SHOULDER ARTHROSCOPY, DEBRIDEMENT, WITH MINI OPEN ROTATOR CUFF TEAR REPAIR & BICEPS TENODESIS;  Surgeon: Cammy Copa, MD;  Location: [facility] OR;  Service: Orthopedics;  Laterality: Right;   TONSILLECTOMY     TUBAL LIGATION     Vanessa [name] Active Problem List   Diagnosis Date Noted   Complete tear of right rotator cuff    Biceps tendonitis on right    Degenerative superior labral anterior-to-posterior (SLAP) tear of right shoulder    Unilateral primary osteoarthritis, right knee 05/13/2020    PCP: Hix, Maebelle Munroe MD  REFERRING PROVIDER: Eldred Manges, MD  REFERRING DIAG:  M54.50 (ICD-10-CM) - Acute bilateral low back pain, unspecified whether sciatica present  Rationale for Evaluation and Treatment: Rehabilitation  THERAPY DIAG:  Other low back pain  Muscle weakness (generalized)  Difficulty in walking, not elsewhere classified  ONSET DATE: 1 month ago   SUBJECTIVE:  SUBJECTIVE STATEMENT: Pt indicated similar complaints today.  Did one day that was notably improved to 2/10 on day or two after visit.   Pt indicated no ill impact from last visit.  Reported rt leg weak with stairs.   GROC: +2  PERTINENT HISTORY:  Medical Hx: OA, Endometrial cancer, depression, GERD, Hepatitis, HTN, hypothyroidism  PAIN:  NPRS scale:  4/10 at most today.  Pain location: rt side of low back and into buttock Pain description: sharp, "catching pain", achy, "always there" Aggravating factors: Going up and down stairs, standing for long periods of time Relieving factors: sitting down, pain medicine- asprin/tylenol   PRECAUTIONS: None  WEIGHT BEARING RESTRICTIONS: No  FALLS:  Has Vanessa [name] fallen in last 6 months? 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 Ophelia Charter   OBJECTIVE:   DIAGNOSTIC FINDINGS:  AP lateral lumbar images are obtained and reviewed there is left lumbar  curvature less than 20 degrees.  Anterolisthesis  L5-S1 with degenerative  facet changes grade 1.  Some aortic calcification noted.   Impression: Negative for acute changes.  Degenerative anterolisthesis  L5-S1.   Vanessa [name] SURVEYS:  Vanessa [name]-Specific Activity Scoring Scheme  "0" represents "unable to perform." "10" represents "able to perform at prior level. 0 1 2 3 4 5 6 7 8 9  10 (Date and Score)   Activity 04/11/2023 04/25/2023  1. Long fast walk  3  3  2. Lifting heavy items   3  3  3. Playing golf 2 3  4. Walking up and down stairs 2 3  5. Standing for >62min 2 4  Score 2.4/10 3.2   Total score = sum of the activity scores/number of activities Minimum detectable change (90%CI) for average score = 2 points Minimum detectable change (90%CI) for single activity score = 3 points  SCREENING FOR RED FLAGS: 04/11/2023 Bowel or bladder incontinence: No Cauda equina syndrome: No  COGNITION: 04/11/2023 Overall cognitive status: WFL normal      SENSATION: 04/11/2023 [facility]  MUSCLE LENGTH: 04/11/2023 None tested  POSTURE:  04/11/2023 No Significant postural limitations  PALPATION: 04/19/2023: Trigger points with concordant localized symptoms Rt glute max/med  04/11/2023 Tender to areas of the R glute/ piriformis area Non tender to lower back musculature   LUMBAR ROM:  04/11/2023 Directional Preference Assessment: Centralization: non specific Peripheralization: non specific  AROM 04/11/2023 04/19/2023  Flexion Can reach toes without pain   Extension WFL 75% today  Right lateral flexion WFL   Left lateral flexion WFL   Right rotation 75%   Left rotation WFL    (Blank rows = not tested)  LOWER EXTREMITY ROM:      Right eval Left eval  Hip flexion    Hip extension    Hip abduction    Hip adduction    Hip internal rotation    Hip external rotation    Knee flexion    Knee extension    Ankle dorsiflexion    Ankle plantarflexion    Ankle inversion    Ankle eversion     (Blank  rows = not tested)  LOWER EXTREMITY MMT:     MMT Right 04/11/2023 Left 04/11/2023  Hip flexion    Hip extension 3- 3-  Hip abduction 4 5  Hip adduction    Hip internal rotation    Hip external rotation    Knee flexion    Knee extension    Ankle dorsiflexion    Ankle plantarflexion    Ankle inversion    Ankle eversion     (Blank rows = not tested)  LUMBAR SPECIAL TESTS:  04/11/2023 Not tested  FUNCTIONAL TESTS:  04/11/2023 Not tested  GAIT:  04/11/2023: ambulates without AD, no notable deviations                                                                                                                                                                                                                   TODAY'S TREATMENT:                                                                                                         DATE: 04/25/2023  Therex: Nustep Lvl 6 8 mins UE/LE  Leg press double leg 75 lbs x 15 , single leg 2 x 15 21 lbs performed bilaterally  Supine bridge 2 x 10 2-3 sec hold hold Supine figure 4 pull towards 15 sec x 5 bilateral   Manual: Percussive device to Rt QL and glute to Vanessa [name] tolerance.  Discussed purchase options for use at home while performing manual intervention.    Trigger Point Dry Needling Subsequent Treatment: Instructions provided previously at initial dry needling treatment.  Vanessa [name] Verbal Consent Given: Yes Education Handout Provided: Previously Provided Muscles treated: Rt glute max Treatment response/outcome: local twitch response    TODAY'S TREATMENT:  DATE: 04/19/2023  Therex: Lt sidelying regional rotation self stretch 4x15sec hold Figure 4x15sec ea Supine bridge with blue tband hip abduction 2x10 Supine hooklying clam shell with isometric hold opposite leg blue tband x 20 bilateral  Standing lumbar extension AROM x10   Manual: Percussive device to Rt QL and glute to  Vanessa [name] tolerance  Self Care Education verbally on post manual and needling soreness possibility and effective strategy to help address and improve following visit.  Strategies included but not limited to:  heat/ice prn, increased water intake, use of HEP and general mobility to move muscle soreness out.  Pt voiced understanding.    Trigger Point Dry Needling Initial Treatment: Pt instructed on Dry Needling rational, procedures, and possible side effects. Pt instructed to expect mild to moderate muscle soreness later in the day and/or into the next day.  Pt instructed in methods to reduce muscle soreness. Pt instructed to continue prescribed HEP. Vanessa [name] verbalized understanding of these instructions and education.  Vanessa [name] Verbal Consent Given: Yes Education Handout Provided: Yes Muscles treated: Rt glute max Treatment response/outcome: local twitch response    TREATMENT:                                                                                                         DATE: 04/13/2023  Therex: Lt sidelying regional rotation self stretch 15 sec x 4 Prone opposite arm/leg lift 3 sec hold x 10 bilateral  Supine bridge c tband hip abduction blue band 2 x 10 Supine hooklying clam shell with isometric hold opposite leg blue band x 20 bilateral  Supine pball press down 10 sec x 10 for ab activation Seated multifidi activation UE lift into table 10 sec hold x 10 Standing lumbar extension AROM x 10 total  Review of existing HEP and updates.    Manual: Lt sidelying regional rotation lumbar g4 mobs with cavitation noted taking up slack.  Percussive device to Rt QL, paraspinals in Lt sidelying.     Vanessa [name] EDUCATION:  04/19/2023 Education details:DN Person educated: Vanessa [name] Education method: Programmer, multimedia, Facilities manager, Verbal cues, and Handouts Education comprehension: verbalized understanding, returned demonstration, and verbal cues required  HOME EXERCISE PROGRAM: Access Code:  [email or web address] URL: [email or web address] Date: 04/13/2023 Prepared by: Chyrel Masson  Exercises - Supine Figure 4 Piriformis Stretch  - 2-3 x daily - 7 x weekly - 1 sets - 3-5 reps - 20-30 hold - Supine Bridge  - 1-2 x daily - 7 x weekly - 1-2 sets - 10 reps - 2 hold - Clamshell  - 1-2 x daily - 7 x weekly - 2-3 sets - 10 reps - Standing Lumbar Extension with Counter  - 2-3 x daily - 7 x weekly - 1 sets - 5-10 reps - Prone Press Up  - 1-2 x daily - 7 x weekly - 1-2 sets - 10 reps - 1-2 hold - Prone Alternating Arm and Leg Lifts  - 1-2 x daily - 7 x weekly - 1-2 sets - 10 reps - 3 hold - Seated Multifidi Isometric  - 1-2 x daily -  7 x weekly - 1 sets - 10 reps - 5-10 hold - Sidelying Lumbar Rotation Stretch  - 2-3 x daily - 7 x weekly - 1 sets - 5 reps - 15 hold  ASSESSMENT:  CLINICAL IMPRESSION: Pt indicated first day of reduced symptoms, noted after last visit with dry needling and myofascial work.  Symptoms did return to normal range but noting that improvement was important.  Repeated dry needling and in clinic manual with additional time spent to continue to address area.  Concordant symptoms were still noted in Rt glute max region.  Pt does return to MD today.  Recommended continued skilled treatment to continue to address as able.     OBJECTIVE IMPAIRMENTS: decreased activity tolerance, decreased balance, decreased endurance, decreased knowledge of condition, decreased mobility, difficulty walking, decreased strength, and pain.   ACTIVITY LIMITATIONS: carrying, lifting, bending, sitting, standing, squatting, stairs, and locomotion level  PARTICIPATION LIMITATIONS: meal prep, cleaning, laundry, driving, shopping, community activity, and yard work  PERSONAL FACTORS: Time since onset of injury/illness/exacerbation and 1-2 comorbidities: see above  are also affecting Vanessa [name]'s functional outcome.   REHAB POTENTIAL: Good  CLINICAL DECISION MAKING:  Stable/uncomplicated  EVALUATION COMPLEXITY: Low   GOALS: Goals reviewed with Vanessa [name]? Yes  SHORT TERM GOALS: (target date for Short term goals are 3 weeks 05/02/2023)  1. Vanessa [name] will demonstrate independent use of home exercise program to maintain progress from in clinic treatments.  Goal status: Met   LONG TERM GOALS: (target dates for all long term goals are 10 weeks  06/20/2023 )   1. Vanessa [name] will demonstrate/report pain at worst less than or equal to 2/10 to facilitate minimal limitation in daily activity secondary to pain symptoms.  Goal status: on going 04/25/2023   2. Vanessa [name] will demonstrate independent use of home exercise program to facilitate ability to maintain/progress functional gains from skilled physical therapy services.  Goal status: on going 04/25/2023   3. Vanessa [name] will demonstrate Vanessa [name] specific functional scale avg > or = 8/10 to indicate reduced disability due to condition.   Goal status: on going 04/25/2023   4. Vanessa [name] will demonstrate lumbar extension 100 % WFL s symptoms to facilitate upright standing, walking posture at PLOF s limitation.  Goal status: on going 04/25/2023   5.  Vanessa [name] will demonstrate up and down a flight of stairs with single hand rail with reciprocal gait pattern s symptoms. Goal status: on going 04/25/2023   6.  Vanessa [name] will demonstrate squatting to pick up moderate weight items without reproduction of pain Goal status: on going 04/25/2023   7.  Vanessa [name] will report walking long distances in the community with </=2/10 pain to return to PLOF  Goal Status: on going 04/25/2023  PLAN:  PT FREQUENCY: 1-2x/week  PT DURATION: 10 weeks  PLANNED INTERVENTIONS: Can include 91478- PT Re-evaluation, 97110-Therapeutic exercises, 97530- Therapeutic activity, 97112- Neuromuscular re-education, 97535- Self Care, 97140- Manual therapy, [phone]- Gait training, [phone]- Orthotic Fit/training, [phone]- Canalith repositioning, [id number]- Aquatic Therapy,  [phone]- Electrical stimulation (unattended), 97750 Physical performance testing, [id number]- Electrical stimulation (manual), 97016- Vasopneumatic device, [id number]- Ultrasound, [id number]- Traction (mechanical), [id number]- Ionotophoresis 4mg /ml Dexamethasone, Vanessa [name]/Family education, Balance training, Stair training, Taping, Dry Needling, Joint mobilization, Joint manipulation, Spinal manipulation, Spinal mobilization, Scar mobilization, Vestibular training, Visual/preceptual remediation/compensation, DME instructions, Cryotherapy, and Moist heat.  All performed as medically necessary.  All included unless contraindicated  PLAN FOR NEXT SESSION: Recheck of MD visit, dry needling/manual response.    Chyrel Masson, PT, DPT, OCS, ATC 04/25/23  12:28 PM

## 2023-05-01 ENCOUNTER — Ambulatory Visit (INDEPENDENT_AMBULATORY_CARE_PROVIDER_SITE_OTHER): Admitting: Rehabilitative and Restorative Service Providers"

## 2023-05-01 ENCOUNTER — Encounter: Payer: Self-pay | Admitting: Rehabilitative and Restorative Service Providers"

## 2023-05-01 DIAGNOSIS — R262 Difficulty in walking, not elsewhere classified: Secondary | ICD-10-CM

## 2023-05-01 DIAGNOSIS — M5459 Other low back pain: Secondary | ICD-10-CM | POA: Diagnosis not present

## 2023-05-01 DIAGNOSIS — M6281 Muscle weakness (generalized): Secondary | ICD-10-CM

## 2023-05-01 NOTE — Therapy (Signed)
 OUTPATIENT PHYSICAL THERAPY TREATMENT   Patient Name: Vanessa [name] MRN: [id number] DOB:[birth year], 73 y.o., female Today's Date: 05/01/2023  END OF SESSION:  PT End of Session - 05/01/23 1250     Visit Number 5    Number of Visits 10    Date for PT Re-Evaluation 06/20/23    Authorization Type Medicare    Progress Note Due on Visit 10    PT Start Time 1257    PT Stop Time 1325    PT Time Calculation (min) 28 min    Activity Tolerance Patient tolerated treatment well    Behavior During Therapy WFL for tasks assessed/performed                Past Medical History:  Diagnosis Date   Aortic valve sclerosis    Arthritis    osteoarthritis   Cancer (HCC)    endometrial   Chronic kidney disease    stage 3   Depression    GERD (gastroesophageal reflux disease)    Heart murmur    AV sclerosis; 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 but no obvious stenosis or regurgitation 12/04/17 echo ([facility])   Hepatitis    History of kidney stones    Hypertension    Hypothyroidism    PONV (postoperative nausea and vomiting)    Past Surgical History:  Procedure Laterality Date   ABDOMINAL HYSTERECTOMY  2008   BREAST SURGERY Right 06/20/2011   lumpectomy for "atypical ductal hyperplasia"   SHOULDER ARTHROSCOPY WITH ROTATOR CUFF REPAIR AND SUBACROMIAL DECOMPRESSION Right 01/29/2021   Procedure: RIGHT SHOULDER ARTHROSCOPY, DEBRIDEMENT, WITH MINI OPEN ROTATOR CUFF TEAR REPAIR & BICEPS TENODESIS;  Surgeon: Cammy Copa, MD;  Location: [facility] OR;  Service: Orthopedics;  Laterality: Right;   TONSILLECTOMY     TUBAL LIGATION     Patient Active Problem List   Diagnosis Date Noted   Degenerative spondylolisthesis 04/25/2023   Neural foraminal stenosis of lumbar spine 04/25/2023   Complete tear of right rotator cuff    Biceps tendonitis on right    Degenerative superior labral anterior-to-posterior (SLAP) tear of right shoulder    Unilateral primary osteoarthritis, right knee  05/13/2020    PCP: Hix, Maebelle Munroe MD  REFERRING PROVIDER: Eldred Manges, MD  REFERRING DIAG: M54.50 (ICD-10-CM) - Acute bilateral low back pain, unspecified whether sciatica present  Rationale for Evaluation and Treatment: Rehabilitation  THERAPY DIAG:  Other low back pain  Muscle weakness (generalized)  Difficulty in walking, not elsewhere classified  ONSET DATE: 1 month ago   SUBJECTIVE:  SUBJECTIVE STATEMENT: Saw MD for follow up and indicated MRI to be performed.  Reported pain noted in Rt buttock/hip and noticed some complaints from Lt side as well in time since last visit.  Moderate symptoms reported upon arrival today.   PERTINENT HISTORY:  Medical Hx: OA, Endometrial cancer, depression, GERD, Hepatitis, HTN, hypothyroidism  PAIN:  NPRS scale:  moderate Pain location: rt side of low back and into buttock Pain description: sharp, "catching pain", achy, "always there" Aggravating factors: Going up and down stairs, standing for long periods of time Relieving factors: sitting down, pain medicine- asprin/tylenol   PRECAUTIONS: None  WEIGHT BEARING RESTRICTIONS: No  FALLS:  Has patient fallen in last 6 months? 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 Ophelia Charter   OBJECTIVE:   DIAGNOSTIC FINDINGS:  AP lateral lumbar  images are obtained and reviewed there is left lumbar  curvature less than 20 degrees.  Anterolisthesis L5-S1 with degenerative  facet changes grade 1.  Some aortic calcification noted.   Impression: Negative for acute changes.  Degenerative anterolisthesis  L5-S1.   PATIENT SURVEYS:  Patient-Specific Activity Scoring Scheme  "0" represents "unable to perform." "10" represents "able to perform at prior level. 0 1 2 3 4 5 6 7 8 9  10 (Date and Score)   Activity 04/11/2023 04/25/2023  1. Long fast walk  3  3  2. Lifting heavy items   3  3  3. Playing golf 2 3  4. Walking up and down stairs 2 3  5. Standing for >59min 2 4  Score 2.4/10 3.2   Total score = sum of the activity scores/number of activities Minimum detectable change (90%CI) for average score = 2 points Minimum detectable change (90%CI) for single activity score = 3 points  SCREENING FOR RED FLAGS: 04/11/2023 Bowel or bladder incontinence: No Cauda equina syndrome: No  COGNITION: 04/11/2023 Overall cognitive status: WFL normal      SENSATION: 04/11/2023 [facility]  MUSCLE LENGTH: 04/11/2023 None tested  POSTURE:  04/11/2023 No Significant postural limitations  PALPATION: 04/19/2023: Trigger points with concordant localized symptoms Rt glute max/med  04/11/2023 Tender to areas of the R glute/ piriformis area Non tender to lower back musculature   LUMBAR ROM:  04/11/2023 Directional Preference Assessment: Centralization: non specific Peripheralization: non specific  AROM 04/11/2023 04/19/2023 05/01/2023  Flexion Can reach toes without pain  To mid shin with mild painful arc upon return  Extension WFL 75% today 100 % WFL with improved feelings  Right lateral flexion WFL    Left lateral flexion WFL    Right rotation 75%    Left rotation WFL     (Blank rows = not tested)  LOWER EXTREMITY ROM:      Right eval Left eval  Hip flexion    Hip extension    Hip abduction    Hip adduction    Hip internal rotation    Hip  external rotation    Knee flexion    Knee extension    Ankle dorsiflexion    Ankle plantarflexion  Ankle inversion    Ankle eversion     (Blank rows = not tested)  LOWER EXTREMITY MMT:    MMT Right 04/11/2023 Left 04/11/2023 Right 05/01/2023 Left 05/01/2023  Hip flexion      Hip extension 3- 3- 5/5 5/5  Hip abduction 4 5 4+/5 5/5  Hip adduction      Hip internal rotation      Hip external rotation      Knee flexion      Knee extension      Ankle dorsiflexion      Ankle plantarflexion      Ankle inversion      Ankle eversion       (Blank rows = not tested)  LUMBAR SPECIAL TESTS:  04/11/2023 Not tested  FUNCTIONAL TESTS:  04/11/2023 Not tested  GAIT:  04/11/2023: ambulates without AD, no notable deviations                                                                                                                                                                                                                   TODAY'S TREATMENT:                                                                                                         DATE: 05/01/2023  Therex: Review of existing HEP for continued use.  Advise given for using for short term relief as able and hoping routine use can carry forward.  Standing hip hike 3 sec hold x 10 bilateral  Lt sidelying regional rotation stretch 15 sec x 5   Manual: Percussive device to bilateral QL and glute to patient tolerance.      TODAY'S TREATMENT:  DATE: 04/25/2023  Therex: Nustep Lvl 6 8 mins UE/LE  Leg press double leg 75 lbs x 15 , single leg 2 x 15 21 lbs performed bilaterally  Supine bridge 2 x 10 2-3 sec hold hold Supine figure 4 pull towards 15 sec x 5 bilateral   Manual: Percussive device to Rt QL and glute to patient tolerance.  Discussed purchase options for use at home while performing manual intervention.     Trigger Point Dry Needling Subsequent Treatment: Instructions provided previously at initial dry needling treatment.  Patient Verbal Consent Given: Yes Education Handout Provided: Previously Provided Muscles treated: Rt glute max Treatment response/outcome: local twitch response    TODAY'S TREATMENT:                                                                                                         DATE: 04/19/2023  Therex: Lt sidelying regional rotation self stretch 4x15sec hold Figure 4x15sec ea Supine bridge with blue tband hip abduction 2x10 Supine hooklying clam shell with isometric hold opposite leg blue tband x 20 bilateral  Standing lumbar extension AROM x10   Manual: Percussive device to Rt QL and glute to patient tolerance  Self Care Education verbally on post manual and needling soreness possibility and effective strategy to help address and improve following visit.  Strategies included but not limited to:  heat/ice prn, increased water intake, use of HEP and general mobility to move muscle soreness out.  Pt voiced understanding.    Trigger Point Dry Needling Initial Treatment: Pt instructed on Dry Needling rational, procedures, and possible side effects. Pt instructed to expect mild to moderate muscle soreness later in the day and/or into the next day.  Pt instructed in methods to reduce muscle soreness. Pt instructed to continue prescribed HEP. Patient verbalized understanding of these instructions and education.  Patient Verbal Consent Given: Yes Education Handout Provided: Yes Muscles treated: Rt glute max Treatment response/outcome: local twitch response   PATIENT EDUCATION:  04/19/2023 Education details:DN Person educated: Patient Education method: Programmer, multimedia, Facilities manager, Verbal cues, and Handouts Education comprehension: verbalized understanding, returned demonstration, and verbal cues required  HOME EXERCISE PROGRAM: Access Code:  [email or web address] URL: [email or web address] Date: 04/13/2023 Prepared by: Chyrel Masson  Exercises - Supine Figure 4 Piriformis Stretch  - 2-3 x daily - 7 x weekly - 1 sets - 3-5 reps - 20-30 hold - Supine Bridge  - 1-2 x daily - 7 x weekly - 1-2 sets - 10 reps - 2 hold - Clamshell  - 1-2 x daily - 7 x weekly - 2-3 sets - 10 reps - Standing Lumbar Extension with Counter  - 2-3 x daily - 7 x weekly - 1 sets - 5-10 reps - Prone Press Up  - 1-2 x daily - 7 x weekly - 1-2 sets - 10 reps - 1-2 hold - Prone Alternating Arm and Leg Lifts  - 1-2 x daily - 7 x weekly - 1-2 sets - 10 reps - 3 hold - Seated Multifidi Isometric  - 1-2 x daily - 7 x weekly -  1 sets - 10 reps - 5-10 hold - Sidelying Lumbar Rotation Stretch  - 2-3 x daily - 7 x weekly - 1 sets - 5 reps - 15 hold  ASSESSMENT:  CLINICAL IMPRESSION: At this time, limited overall functional progress with symptoms still noted.  Based off MD visit recommendations and today's presentation, recommended holding in clinic PT with continued HEP management until MRI results are made known.  MRI application warranted due to lack of improvement as noted. May be candidate for injection pending MRI.   OBJECTIVE IMPAIRMENTS: decreased activity tolerance, decreased balance, decreased endurance, decreased knowledge of condition, decreased mobility, difficulty walking, decreased strength, and pain.   ACTIVITY LIMITATIONS: carrying, lifting, bending, sitting, standing, squatting, stairs, and locomotion level  PARTICIPATION LIMITATIONS: meal prep, cleaning, laundry, driving, shopping, community activity, and yard work  PERSONAL FACTORS: Time since onset of injury/illness/exacerbation and 1-2 comorbidities: see above  are also affecting patient's functional outcome.   REHAB POTENTIAL: Good  CLINICAL DECISION MAKING: Stable/uncomplicated  EVALUATION COMPLEXITY: Low   GOALS: Goals reviewed with patient? Yes  SHORT TERM GOALS: (target date for  Short term goals are 3 weeks 05/02/2023)  1. Patient will demonstrate independent use of home exercise program to maintain progress from in clinic treatments.  Goal status: Met   LONG TERM GOALS: (target dates for all long term goals are 10 weeks  06/20/2023 )   1. Patient will demonstrate/report pain at worst less than or equal to 2/10 to facilitate minimal limitation in daily activity secondary to pain symptoms.  Goal status: on going 04/25/2023   2. Patient will demonstrate independent use of home exercise program to facilitate ability to maintain/progress functional gains from skilled physical therapy services.  Goal status: on going 04/25/2023   3. Patient will demonstrate Patient specific functional scale avg > or = 8/10 to indicate reduced disability due to condition.   Goal status: on going 04/25/2023   4. Patient will demonstrate lumbar extension 100 % WFL s symptoms to facilitate upright standing, walking posture at PLOF s limitation.  Goal status: on going 04/25/2023   5.  Patient will demonstrate up and down a flight of stairs with single hand rail with reciprocal gait pattern s symptoms. Goal status: on going 04/25/2023   6.  Patient will demonstrate squatting to pick up moderate weight items without reproduction of pain Goal status: on going 04/25/2023   7.  patient will report walking long distances in the community with </=2/10 pain to return to PLOF  Goal Status: on going 04/25/2023  PLAN:  PT FREQUENCY: 1-2x/week  PT DURATION: 10 weeks  PLANNED INTERVENTIONS: Can include 21308- PT Re-evaluation, 97110-Therapeutic exercises, 97530- Therapeutic activity, 97112- Neuromuscular re-education, 97535- Self Care, 97140- Manual therapy, [phone]- Gait training, [phone]- Orthotic Fit/training, [phone]- Canalith repositioning, [id number]- Aquatic Therapy, [phone]- Electrical stimulation (unattended), 97750 Physical performance testing, [id number]- Electrical stimulation (manual), 97016-  Vasopneumatic device, [id number]- Ultrasound, [id number]- Traction (mechanical), [id number]- Ionotophoresis 4mg /ml Dexamethasone, Patient/Family education, Balance training, Stair training, Taping, Dry Needling, Joint mobilization, Joint manipulation, Spinal manipulation, Spinal mobilization, Scar mobilization, Vestibular training, Visual/preceptual remediation/compensation, DME instructions, Cryotherapy, and Moist heat.  All performed as medically necessary.  All included unless contraindicated  PLAN FOR NEXT SESSION: Hold PT until MRI, possible injection.    Chyrel Masson, PT, DPT, OCS, ATC 05/01/23  1:43 PM

## 2023-05-03 ENCOUNTER — Encounter: Admitting: Rehabilitative and Restorative Service Providers"

## 2023-05-15 ENCOUNTER — Ambulatory Visit
Admission: RE | Admit: 2023-05-15 | Discharge: 2023-05-15 | Disposition: A | Discharge status: Home / Self Care | Source: Ambulatory Visit | Attending: Orthopaedic Surgery | Admitting: Orthopaedic Surgery

## 2023-05-15 DIAGNOSIS — M545 Low back pain, unspecified: Secondary | ICD-10-CM

## 2023-05-25 ENCOUNTER — Other Ambulatory Visit (INDEPENDENT_AMBULATORY_CARE_PROVIDER_SITE_OTHER): Payer: Self-pay

## 2023-05-25 ENCOUNTER — Ambulatory Visit (INDEPENDENT_AMBULATORY_CARE_PROVIDER_SITE_OTHER): Admitting: Orthopedic Surgery

## 2023-05-25 VITALS — BP 141/78 | HR 83 | Ht 65.0 in | Wt 169.0 lb

## 2023-05-25 DIAGNOSIS — M545 Low back pain, unspecified: Secondary | ICD-10-CM

## 2023-05-25 DIAGNOSIS — M5416 Radiculopathy, lumbar region: Secondary | ICD-10-CM

## 2023-05-25 NOTE — Progress Notes (Signed)
 Orthopedic Spine Surgery Office Note  Assessment: Patient is a 73 y.o. female with low back pain that radiates into bilateral lower extremities.  Notices it when walking or standing, improves when sitting or leaning forward consistent with neurogenic claudication.  Has stenosis at L4/5   Plan: -Explained that initially conservative treatment is tried as a significant number of patients may experience relief with these treatment modalities. Discussed that the conservative treatments include:  -activity modification  -physical therapy  -over the counter pain medications  -medrol dosepak  -lumbar steroid injections -Patient has tried PT, Tylenol, lidocaine patch -Cannot do NSAIDs due to her CKD -Recommended diagnostic/therapeutic injection with Dr. Daisey Dryer.  Referral provided to her today -Patient should return to office in 6 weeks, x-rays at next visit: None   Patient expressed understanding of the plan and all questions were answered to the patient's satisfaction.   ___________________________________________________________________________   History:  Patient is a 73 y.o. female who presents today for lumbar spine.  Patient has had about 3 months of low back pain that radiates into her bilateral lower extremities.  Her right leg is more painful than her left.  She feels a going along the posterior aspect of the thigh to the level of the knee.  She also feels in the buttock.  It does get better when she sits down or leans forward.  She does find herself leaning over a shopping cart to help the pain when she is at the store.  She rates the pain as an 8 out of 10 at its worst.  She has no pain radiating past either knee.  There is no trauma or injury that preceded the onset of pain.   Weakness: Yes, right leg feels weak when going up stairs.  No other weakness noted Symptoms of imbalance: Denies Paresthesias and numbness: Denies Bowel or bladder incontinence: Denies Saddle anesthesia:  Denies  Treatments tried: PT, Tylenol, lidocaine patch  Review of systems: Denies fevers and chills, night sweats, unexplained weight loss, pain that wakes her at night.  Has a history of endometrial cancer  Past medical history: Anxiety History of endometrial cancer HLD HTN CAD CKD GERD  Allergies: lisinopril, codeine, morphine  Past surgical history:  Right ACL reconstruction Right shoulder rotator cuff repair Hysterectomy Tonsillectomy  Social history: Denies use of nicotine product (smoking, vaping, patches, smokeless) Alcohol use: Yes, approximately 3-4 drinks per week Denies recreational drug use   Physical Exam:  BMI of 28.1  General: no acute distress, appears stated age Neurologic: alert, answering questions appropriately, following commands Respiratory: unlabored breathing on room air, symmetric chest rise Psychiatric: appropriate affect, normal cadence to speech   MSK (spine):  -Strength exam      Left  Right EHL    5/5  5/5 TA    5/5  5/5 GSC    5/5  5/5 Knee extension  5/5  5/5 Hip flexion   5/5  5/5  -Sensory exam    Sensation intact to light touch in L3-S1 nerve distributions of bilateral lower extremities  -Achilles DTR: 2/4 on the left, 2/4 on the right -Patellar tendon DTR: 2/4 on the left, 2/4 on the right  -Straight leg raise: Negative bilaterally -Femoral nerve stretch test: Negative bilaterally -Clonus: no beats bilaterally  -Left hip exam: No pain through range of motion, negative Stinchfield, negative FABER -Right hip exam: No pain through range of motion, negative Stinchfield, negative FABER  Imaging: XRs of the lumbar spine from 05/25/2023 were independently reviewed and interpreted,  showing grade 1 spondylolisthesis at L5/S1 that shifts about 1 mm between the supine MRI and the standing lateral film.  Disc height loss at L4/5.  Lumbar scoliosis with apex to the left at L4.  Cobb angle of 14 degrees.  No fracture or  dislocation seen.  MRI of the lumbar spine from 05/15/2023 was independently reviewed and interpreted, showing central and lateral recess stenosis at L4/5. Left sided foraminal stenosis and lateral recess stenosis at L5/S1.    Patient name: Vanessa [name] Patient MRN: [id number] Date of visit: 05/25/23

## 2023-05-25 NOTE — Addendum Note (Signed)
 Addended by: Colette Davies on: 05/25/2023 02:05 PM   Modules accepted: Orders

## 2023-06-07 ENCOUNTER — Ambulatory Visit (INDEPENDENT_AMBULATORY_CARE_PROVIDER_SITE_OTHER): Admitting: Physical Medicine and Rehabilitation

## 2023-06-07 ENCOUNTER — Other Ambulatory Visit: Payer: Self-pay

## 2023-06-07 VITALS — BP 146/75 | HR 70

## 2023-06-07 DIAGNOSIS — M5416 Radiculopathy, lumbar region: Secondary | ICD-10-CM | POA: Diagnosis not present

## 2023-06-07 MED ORDER — METHYLPREDNISOLONE ACETATE 40 MG/ML IJ SUSP
40.0000 mg | Freq: Once | INTRAMUSCULAR | Status: AC
  Administered 2023-06-07: 40 mg

## 2023-06-07 NOTE — Patient Instructions (Signed)

## 2023-06-07 NOTE — Progress Notes (Signed)
 Pain Scale   Average Pain 6 Patient advising she had lower back pain and it increases when she moves in different sides" the pain runs down my right hip area."         +Driver, -BT, -Dye Allergies.

## 2023-06-07 NOTE — Procedures (Signed)
 Lumbosacral Transforaminal Epidural Steroid Injection - Sub-Pedicular Approach with Fluoroscopic Guidance  Patient: Vanessa [name]      Date of Birth: [birth year] MRN: [id number] PCP: Mathis Som, MD      Visit Date: 06/07/2023   Universal Protocol:    Date/Time: 06/07/2023  Consent Given By: the patient  Position: PRONE  Additional Comments: Vital signs were monitored before and after the procedure. Patient was prepped and draped in the usual sterile fashion. The correct patient, procedure, and site was verified.   Injection Procedure Details:   Procedure diagnoses: Lumbar radiculopathy [M54.16]    Meds Administered:  Meds ordered this encounter  Medications   methylPREDNISolone  acetate (DEPO-MEDROL ) injection 40 mg    Laterality: Right  Location/Site: L4  Needle:5.0 in., 22 ga.  Short bevel or Quincke spinal needle  Needle Placement: Transforaminal  Findings:    -Comments: Excellent flow of contrast along the nerve, nerve root and into the epidural space.  Procedure Details: After squaring off the end-plates to get a true AP view, the C-arm was positioned so that an oblique view of the foramen as noted above was visualized. The target area is just inferior to the "nose of the scotty dog" or sub pedicular. The soft tissues overlying this structure were infiltrated with 2-3 ml. of 1% Lidocaine  without Epinephrine .  The spinal needle was inserted toward the target using a "trajectory" view along the fluoroscope beam.  Under AP and lateral visualization, the needle was advanced so it did not puncture dura and was located close the 6 O'Clock position of the pedical in AP tracterory. Biplanar projections were used to confirm position. Aspiration was confirmed to be negative for CSF and/or blood. A 1-2 ml. volume of Isovue-250 was injected and flow of contrast was noted at each level. Radiographs were obtained for documentation purposes.   After attaining the  desired flow of contrast documented above, a 0.5 to 1.0 ml test dose of 0.25% Marcaine  was injected into each respective transforaminal space.  The patient was observed for 90 seconds post injection.  After no sensory deficits were reported, and normal lower extremity motor function was noted,   the above injectate was administered so that equal amounts of the injectate were placed at each foramen (level) into the transforaminal epidural space.   Additional Comments:  The patient tolerated the procedure well Dressing: 2 x 2 sterile gauze and Band-Aid    Post-procedure details: Patient was observed during the procedure. Post-procedure instructions were reviewed.  Patient left the clinic in stable condition.

## 2023-06-07 NOTE — Progress Notes (Signed)
 Vanessa [name] - 73 y.o. female MRN [id number]  Date of birth: 06-20-1950  Office Visit Note: Visit Date: 06/07/2023 PCP: Geraldine Kling Referred by: Diedra Fowler, MD  Subjective: Chief Complaint  Patient presents with   Lower Back - Pain   HPI:  Vanessa [name] is a 73 y.o. female who comes in today at the request of Dr. Colette Davies for planned Right L4-5 Lumbar Transforaminal epidural steroid injection with fluoroscopic guidance.  The patient has failed conservative care including home exercise, medications, time and activity modification.  This injection will be diagnostic and hopefully therapeutic.  Please see requesting physician notes for further details and justification.   ROS Otherwise per HPI.  Assessment & Plan: Visit Diagnoses:    ICD-10-CM   1. Lumbar radiculopathy  M54.16 XR C-ARM NO REPORT    Epidural Steroid injection    methylPREDNISolone  acetate (DEPO-MEDROL ) injection 40 mg      Plan: No additional findings.   Meds & Orders:  Meds ordered this encounter  Medications   methylPREDNISolone  acetate (DEPO-MEDROL ) injection 40 mg    Orders Placed This Encounter  Procedures   XR C-ARM NO REPORT   Epidural Steroid injection    Follow-up: Return for visit to requesting provider as needed.   Procedures: No procedures performed  Lumbosacral Transforaminal Epidural Steroid Injection - Sub-Pedicular Approach with Fluoroscopic Guidance  Patient: Vanessa [name]      Date of Birth: 1950/06/21 MRN: [id number] PCP: Mathis Som, MD      Visit Date: 06/07/2023   Universal Protocol:    Date/Time: 06/07/2023  Consent Given By: the patient  Position: PRONE  Additional Comments: Vital signs were monitored before and after the procedure. Patient was prepped and draped in the usual sterile fashion. The correct patient, procedure, and site was verified.   Injection Procedure Details:   Procedure diagnoses: Lumbar  radiculopathy [M54.16]    Meds Administered:  Meds ordered this encounter  Medications   methylPREDNISolone  acetate (DEPO-MEDROL ) injection 40 mg    Laterality: Right  Location/Site: L4  Needle:5.0 in., 22 ga.  Short bevel or Quincke spinal needle  Needle Placement: Transforaminal  Findings:    -Comments: Excellent flow of contrast along the nerve, nerve root and into the epidural space.  Procedure Details: After squaring off the end-plates to get a true AP view, the C-arm was positioned so that an oblique view of the foramen as noted above was visualized. The target area is just inferior to the "nose of the scotty dog" or sub pedicular. The soft tissues overlying this structure were infiltrated with 2-3 ml. of 1% Lidocaine  without Epinephrine .  The spinal needle was inserted toward the target using a "trajectory" view along the fluoroscope beam.  Under AP and lateral visualization, the needle was advanced so it did not puncture dura and was located close the 6 O'Clock position of the pedical in AP tracterory. Biplanar projections were used to confirm position. Aspiration was confirmed to be negative for CSF and/or blood. A 1-2 ml. volume of Isovue-250 was injected and flow of contrast was noted at each level. Radiographs were obtained for documentation purposes.   After attaining the desired flow of contrast documented above, a 0.5 to 1.0 ml test dose of 0.25% Marcaine  was injected into each respective transforaminal space.  The patient was observed for 90 seconds post injection.  After no sensory deficits were reported, and normal lower extremity motor function was noted,   the above injectate was administered  so that equal amounts of the injectate were placed at each foramen (level) into the transforaminal epidural space.   Additional Comments:  The patient tolerated the procedure well Dressing: 2 x 2 sterile gauze and Band-Aid    Post-procedure details: Patient was observed  during the procedure. Post-procedure instructions were reviewed.  Patient left the clinic in stable condition.    Clinical History: MRI LUMBAR SPINE WITHOUT CONTRAST   TECHNIQUE: Multiplanar, multisequence MR imaging of the lumbar spine was performed. No intravenous contrast was administered.   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 The last full/open intervertebral disc space is labeled L5-S1 and there is a small disc space at S1-2.   Alignment:  Degenerative anterolisthesis of L5.   Vertebrae:  Normal marrow signal.  No bone lesions or fractures.   Conus medullaris and cauda equina: Conus extends to the L1 level. Conus and cauda equina appear normal.   Paraspinal and other soft tissues: No significant paraspinal or retroperitoneal findings.   Disc levels:   T12-L1: No significant findings. Small right-sided perineural cyst or dilated nerve root sheath.   L1-2: Mild facet disease and ligamentum flavum thickening but no significant spinal or foraminal stenosis.   L2-3: Mild annular bulge and moderate facet disease contributing to mild bilateral lateral recess encroachment. No spinal or foraminal stenosis.   L3-4: Bulging annulus, facet disease and ligamentum flavum thickening contributing to early spinal stenosis and mild bilateral lateral recess stenosis. No foraminal stenosis.   L4-5: Bulging uncovered disc, short pedicles, advanced facet disease and ligamentum flavum thickening all contributing to severe spinal and bilateral lateral recess stenosis. No significant foraminal stenosis.   L5-S1: Degenerative anterolisthesis of L5 due to severe facet disease. There is a bulging uncovered disc in conjunction with advanced facet disease and ligamentum flavum thickening contributing to severe spinal and bilateral lateral recess stenosis. No foraminal stenosis.    IMPRESSION: 1. 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 The last full/open intervertebral disc space is labeled L5-S1 and there is a small disc space at S1-2. 2. Mild bilateral lateral recess encroachment at L2-3. 3. Early spinal stenosis and mild bilateral lateral recess stenosis at L3-4. 4. Severe multifactorial spinal and bilateral lateral recess stenosis at L4-5. 5. Severe multifactorial spinal and bilateral lateral recess stenosis at L5-S1.     Electronically Signed   By: Marrian Siva M.D.   On: 05/20/2023 14:47     Objective:  VS:  HT:    WT:   BMI:     BP:(!) 146/75  HR:70bpm  TEMP: ( )  RESP:  Physical Exam Vitals and nursing note reviewed.  Constitutional:      General: She is not in acute distress.    Appearance: Normal appearance. She is not ill-appearing.  HENT:     Head: Normocephalic and atraumatic.     Right Ear: External ear normal.     Left Ear: External ear normal.  Eyes:     Extraocular Movements: Extraocular movements intact.  Cardiovascular:     Rate and Rhythm: Normal rate.     Pulses: Normal pulses.  Pulmonary:     Effort: Pulmonary effort is normal. No respiratory distress.  Abdominal:     General: There is no distension.     Palpations: Abdomen is soft.  Musculoskeletal:        General: Tenderness present.     Cervical back: Neck supple.     Right  lower leg: No edema.     Left lower leg: No edema.     Comments: Patient has good distal strength with no pain over the greater trochanters.  No clonus or focal weakness.  Skin:    Findings: No erythema, lesion or rash.  Neurological:     General: No focal deficit present.     Mental Status: She is alert and oriented to person, place, and time.     Sensory: No sensory deficit.     Motor: No weakness or abnormal muscle tone.     Coordination: Coordination normal.  Psychiatric:        Mood and Affect: Mood normal.        Behavior: Behavior normal.       Imaging: XR C-ARM NO REPORT Result Date: 06/07/2023 Please see Notes tab for imaging impression.

## 2023-06-26 ENCOUNTER — Encounter: Payer: Self-pay | Admitting: Orthopedic Surgery

## 2023-06-29 ENCOUNTER — Ambulatory Visit (INDEPENDENT_AMBULATORY_CARE_PROVIDER_SITE_OTHER): Admitting: Rehabilitative and Restorative Service Providers"

## 2023-06-29 ENCOUNTER — Encounter: Payer: Self-pay | Admitting: Rehabilitative and Restorative Service Providers"

## 2023-06-29 DIAGNOSIS — M5459 Other low back pain: Secondary | ICD-10-CM | POA: Diagnosis not present

## 2023-06-29 DIAGNOSIS — R262 Difficulty in walking, not elsewhere classified: Secondary | ICD-10-CM

## 2023-06-29 DIAGNOSIS — M79604 Pain in right leg: Secondary | ICD-10-CM

## 2023-06-29 DIAGNOSIS — M6281 Muscle weakness (generalized): Secondary | ICD-10-CM | POA: Diagnosis not present

## 2023-06-29 NOTE — Therapy (Addendum)
 OUTPATIENT PHYSICAL THERAPY TREATMENT /RE-EVAL   Patient Name: Vanessa [name] MRN: [id number] DOB:[birth year], 73 y.o., female Today's Date: 06/29/2023  Progress Note Reporting Period 04/11/2023 to 06/29/2023  See note below for Objective Data and Assessment of Progress/Goals.      END OF SESSION:  PT End of Session - 06/29/23 1345     Visit Number 6    Number of Visits 11    Date for PT Re-Evaluation 08/10/23    Authorization Type Medicare    Progress Note Due on Visit 11    PT Start Time 1345    PT Stop Time 1417    PT Time Calculation (min) 32 min    Activity Tolerance Patient tolerated treatment well    Behavior During Therapy WFL for tasks assessed/performed                 Past Medical History:  Diagnosis Date   Aortic valve sclerosis    Arthritis    osteoarthritis   Cancer (HCC)    endometrial   Chronic kidney disease    stage 3   Depression    GERD (gastroesophageal reflux disease)    Heart murmur    AV sclerosis; 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 but no obvious stenosis or regurgitation 12/04/17 echo ([facility])   Hepatitis    History of kidney stones    Hypertension    Hypothyroidism    PONV (postoperative nausea and vomiting)    Past Surgical History:  Procedure Laterality Date   ABDOMINAL HYSTERECTOMY  2008   BREAST SURGERY Right 06/20/2011   lumpectomy for "atypical ductal hyperplasia"   SHOULDER ARTHROSCOPY WITH ROTATOR CUFF REPAIR AND SUBACROMIAL DECOMPRESSION Right 01/29/2021   Procedure: RIGHT SHOULDER ARTHROSCOPY, DEBRIDEMENT, WITH MINI OPEN ROTATOR CUFF TEAR REPAIR & BICEPS TENODESIS;  Surgeon: Jasmine Mesi, MD;  Location: [facility] OR;  Service: Orthopedics;  Laterality: Right;   TONSILLECTOMY     TUBAL LIGATION     Patient Active Problem List   Diagnosis Date Noted   Degenerative spondylolisthesis 04/25/2023   Neural foraminal stenosis of lumbar spine 04/25/2023   Complete tear of right rotator cuff    Biceps tendonitis on  right    Degenerative superior labral anterior-to-posterior (SLAP) tear of right shoulder    Unilateral primary osteoarthritis, right knee 05/13/2020    PCP: Hix, Barabara Levering MD  REFERRING PROVIDER: Adah Acron, MD  REFERRING DIAG: M54.50 (ICD-10-CM) - Acute bilateral low back pain, unspecified whether sciatica present  Rationale for Evaluation and Treatment: Rehabilitation  THERAPY DIAG:  Other low back pain  Pain in right leg  Muscle weakness (generalized)  Difficulty in walking, not elsewhere classified  ONSET DATE: 1 month ago   SUBJECTIVE:  SUBJECTIVE STATEMENT: Pt indicated having injection in back and having improvement for a day or so with gradual return of symptoms.  Pt. Indicated having pain down Rt leg with jolt pains still occurring.  Pt indicated plan to do surgery after completing physical therapy requirements for insurance.   PERTINENT HISTORY:  Medical Hx: OA, Endometrial cancer, depression, GERD, Hepatitis, HTN, hypothyroidism  PAIN:  NPRS scale:  at worst  8/10.  Current 2-3/10.  Pain location: rt side of low back and into buttock Pain description: sharp, "catching pain", achy, "always there" Aggravating factors: Going up and down stairs, standing for long periods of time Relieving factors: sitting down, pain medicine- asprin/tylenol    PRECAUTIONS: None  WEIGHT BEARING RESTRICTIONS: No  FALLS:  Has patient fallen in last 6 months? 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 Murrel Arnt   OBJECTIVE:   DIAGNOSTIC FINDINGS:  AP lateral lumbar images are obtained and reviewed there is left lumbar  curvature less than 20 degrees.  Anterolisthesis L5-S1 with degenerative  facet changes grade 1.  Some aortic calcification noted.   Impression: Negative for acute changes.  Degenerative anterolisthesis  L5-S1.   PATIENT SURVEYS:  Patient-Specific Activity Scoring Scheme  "0" represents "unable to perform." "10" represents "able to perform at prior level. 0 1 2 3 4 5 6 7 8 9  10 (Date and Score)   Activity 04/11/2023 04/25/2023 06/29/2023  1. Long fast walk  3  3 0  2. Lifting heavy items   3  3 5   3. Playing golf 2 3 5   4. Walking up and down stairs 2 3 3   5. Standing for >71min 2 4 8   Score 2.4/10 3.2 4.2 avg   Total score = sum of the activity scores/number of activities Minimum detectable change (90%CI) for average score = 2 points Minimum detectable change (90%CI) for single activity score = 3 points  SCREENING FOR RED FLAGS: 04/11/2023 Bowel or bladder incontinence: No Cauda equina syndrome: No  COGNITION: 04/11/2023 Overall cognitive status: WFL normal      SENSATION: 04/11/2023 [facility]  MUSCLE LENGTH: 04/11/2023 None tested  POSTURE:  04/11/2023 No Significant postural limitations  PALPATION: 04/19/2023: Trigger points with concordant localized symptoms Rt glute max/med  04/11/2023 Tender to areas of the R glute/ piriformis area Non tender to lower back musculature   LUMBAR ROM:  04/11/2023 Directional Preference Assessment: Centralization: non specific Peripheralization: non specific  AROM 04/11/2023 04/19/2023 05/01/2023 06/29/2023  Flexion Can reach toes without pain  To mid shin with mild painful arc upon return   Extension WFL 75% today 100 % WFL with improved feelings 100%  without complaint  Right lateral flexion WFL     Left lateral flexion WFL     Right rotation 75%     Left rotation WFL      (Blank rows = not tested)  LOWER EXTREMITY ROM:      Right eval Left eval  Hip flexion    Hip extension    Hip abduction    Hip adduction  Hip internal rotation    Hip external rotation    Knee flexion    Knee extension    Ankle dorsiflexion    Ankle plantarflexion    Ankle inversion    Ankle eversion     (Blank rows = not tested)  LOWER EXTREMITY MMT:    MMT Right 04/11/2023 Left 04/11/2023 Right 05/01/2023 Left 05/01/2023 Right 06/29/2023 Left 06/29/2023  Hip flexion     5/5 5/5  Hip extension 3- 3- 5/5 5/5    Hip abduction 4 5 4+/5 5/5    Hip adduction        Hip internal rotation        Hip external rotation        Knee flexion     5/5 5/5  Knee extension     5/5 5/5  Ankle dorsiflexion        Ankle plantarflexion        Ankle inversion        Ankle eversion         (Blank rows = not tested)  LUMBAR SPECIAL TESTS:  06/29/2023: (-) slump bilateral   04/11/2023 Not tested  FUNCTIONAL TESTS:  04/11/2023 Not tested  GAIT:  04/11/2023: ambulates without AD, no notable deviations                                                                                                                                                                                                                   TODAY'S TREATMENT:                                                                                                         DATE: 06/29/2023  Therex: Nustep lvl 6 10 mins UE/LE  Supine lumbar trunk rotation stretch 15 sec x 3 bilateral Supine bridge 2 x 10 2-3 sec hold Supine figure 4 pull towards stretch 30 sec x 5  Standing lumbar extension AROM x 5 Review of existing HEP and updated techniques accordingly, handout provided.    RE-eval performed  TODAY'S TREATMENT:  DATE: 04/25/2023  Therex: Nustep Lvl 6 8 mins UE/LE  Leg press double leg 75 lbs x 15 , single leg 2 x 15 21 lbs performed bilaterally  Supine bridge 2 x 10 2-3 sec hold hold Supine figure 4 pull towards 15 sec x 5 bilateral   Manual: Percussive device to Rt QL and glute to patient tolerance.  Discussed purchase options for use at home while performing manual intervention.    Trigger Point Dry Needling Subsequent Treatment: Instructions provided previously at initial dry needling treatment.  Patient Verbal Consent Given: Yes Education Handout Provided: Previously Provided Muscles treated: Rt glute max Treatment response/outcome: local twitch response    TODAY'S TREATMENT:                                                                                                         DATE: 04/19/2023  Therex: Lt sidelying regional rotation self stretch 4x15sec hold Figure 4x15sec ea Supine bridge with blue tband hip abduction 2x10 Supine hooklying clam shell with isometric hold opposite leg blue tband x 20 bilateral  Standing lumbar extension AROM x10   Manual: Percussive device to Rt QL and glute to patient tolerance  Self Care Education verbally on post manual and needling soreness possibility and effective strategy to help address and improve following visit.  Strategies included but not limited to:  heat/ice prn, increased water intake, use of HEP and general mobility to move muscle soreness out.  Pt voiced understanding.    Trigger Point Dry Needling Initial Treatment: Pt instructed on Dry Needling rational, procedures, and possible side effects. Pt instructed to expect mild to moderate muscle soreness later in the day and/or into the next day.  Pt instructed in methods to reduce muscle soreness. Pt instructed to continue prescribed HEP. Patient verbalized understanding of these instructions and education.  Patient Verbal Consent Given: Yes Education Handout  Provided: Yes Muscles treated: Rt glute max Treatment response/outcome: local twitch response   PATIENT EDUCATION:  06/29/2023 Education details: HEP Person educated: Patient Education method: Programmer, multimedia, Demonstration, Verbal cues, and Handouts Education comprehension: verbalized understanding, returned demonstration, and verbal cues required  HOME EXERCISE PROGRAM: Access Code: [email or web address] URL: [email or web address] Date: 06/29/2023 Prepared by: Bonna Bustard  Exercises - Supine Lower Trunk Rotation  - 2-3 x daily - 7 x weekly - 1 sets - 3-5 reps - 15 hold - Supine Bridge  - 1-2 x daily - 7 x weekly - 1-2 sets - 10 reps - 2 hold - Clamshell  - 1-2 x daily - 7 x weekly - 2-3 sets - 10 reps - Standing Lumbar Extension with Counter  - 2-3 x daily - 7 x weekly - 1 sets - 5-10 reps - Prone Press Up  - 1-2 x daily - 7 x weekly - 1-2 sets - 10 reps - 1-2 hold - Seated Multifidi Isometric  - 1-2 x daily - 7 x weekly - 1 sets - 10 reps - 5-10 hold - Supine Figure 4 Piriformis Stretch with Leg Extension  - 2-3 x daily - 7 x weekly -  1 sets - 3-5 reps - 15-30 hold  ASSESSMENT:  CLINICAL IMPRESSION: The patient has attended 6 visits over the course of treatment cycle.  Patient returned today after about 1.5 months and injection following prompt from MD office regarding continued physical therapy for several more weeks prior to any plan for surgery and insurance approval for surgery.  Pt may benefit from additional 3-4 weeks of treatment to continue to try and address low back/Rt leg pains that have impacted functional activity and recreational activity to this point.     OBJECTIVE IMPAIRMENTS: decreased activity tolerance, decreased balance, decreased endurance, decreased knowledge of condition, decreased mobility, difficulty walking, decreased strength, and pain.   ACTIVITY LIMITATIONS: carrying, lifting, bending, sitting, standing, squatting, stairs, and locomotion  level  PARTICIPATION LIMITATIONS: meal prep, cleaning, laundry, driving, shopping, community activity, and yard work  PERSONAL FACTORS: Time since onset of injury/illness/exacerbation and 1-2 comorbidities: see above are also affecting patient's functional outcome.   REHAB POTENTIAL: Good  CLINICAL DECISION MAKING: Stable/uncomplicated  EVALUATION COMPLEXITY: Low   GOALS: Goals reviewed with patient? Yes  SHORT TERM GOALS: (target date for Short term goals are 3 weeks 05/02/2023)  1. Patient will demonstrate independent use of home exercise program to maintain progress from in clinic treatments.  Goal status: Met   LONG TERM GOALS: (target dates for all long term goals are 6 weeks  08/10/2023 )   1. Patient will demonstrate/report pain at worst less than or equal to 2/10 to facilitate minimal limitation in daily activity secondary to pain symptoms.  Goal status: on going 06/29/2023   2. Patient will demonstrate independent use of home exercise program to facilitate ability to maintain/progress functional gains from skilled physical therapy services.  Goal status: on going 06/29/2022   3. Patient will demonstrate Patient specific functional scale avg > or = 8/10 to indicate reduced disability due to condition.   Goal status: on going 06/29/2023   4. Patient will demonstrate lumbar extension 100 % WFL s symptoms to facilitate upright standing, walking posture at PLOF s limitation.  Goal status: on going 06/29/2023   5.  Patient will demonstrate up and down a flight of stairs with single hand rail with reciprocal gait pattern s symptoms. Goal status: on going 06/29/2023   6.  Patient will demonstrate squatting to pick up moderate weight items without reproduction of pain Goal status: on going 06/29/2023   7.  patient will report walking long distances in the community with </=2/10 pain to return to PLOF  Goal Status: on going 06/29/2023  PLAN:  PT FREQUENCY: 1-2x/week  PT  DURATION: 6 weeks  PLANNED INTERVENTIONS: Can include 16109- PT Re-evaluation, 97110-Therapeutic exercises, 97530- Therapeutic activity, 97112- Neuromuscular re-education, 97535- Self Care, 97140- Manual therapy, [phone]- Gait training, [phone]- Orthotic Fit/training, [phone]- Canalith repositioning, [id number]- Aquatic Therapy, [phone]- Electrical stimulation (unattended), 97750 Physical performance testing, [id number]- Electrical stimulation (manual), 97016- Vasopneumatic device, [id number]- Ultrasound, [id number]- Traction (mechanical), [id number]- Ionotophoresis 4mg /ml Dexamethasone , Patient/Family education, Balance training, Stair training, Taping, Dry Needling, Joint mobilization, Joint manipulation, Spinal manipulation, Spinal mobilization, Scar mobilization, Vestibular training, Visual/preceptual remediation/compensation, DME instructions, Cryotherapy, and Moist heat.  All performed as medically necessary.  All included unless contraindicated  PLAN FOR NEXT SESSION: Therapy for a few more weeks with focus on reduced radicular complaints.    Bonna Bustard, PT, DPT, OCS, ATC 06/29/23  2:21 PM

## 2023-07-05 ENCOUNTER — Encounter: Payer: Self-pay | Admitting: Cardiovascular Disease

## 2023-07-05 NOTE — Telephone Encounter (Signed)
 Called pt to schedule office visit with Dr. Maximo Spar. Pt set up for 9/8 at 10am. Pt has no further questions as this time.

## 2023-07-06 ENCOUNTER — Encounter: Payer: Self-pay | Admitting: Orthopedic Surgery

## 2023-07-06 ENCOUNTER — Ambulatory Visit: Admitting: Orthopedic Surgery

## 2023-07-06 VITALS — BP 115/68 | HR 88 | Ht 65.0 in | Wt 169.0 lb

## 2023-07-06 DIAGNOSIS — M48062 Spinal stenosis, lumbar region with neurogenic claudication: Secondary | ICD-10-CM

## 2023-07-06 NOTE — Progress Notes (Signed)
 Orthopedic Spine Surgery Office Note   Assessment: Patient is a 73 y.o. female with low back pain that radiates into bilateral lower extremities.  Notices it when walking or standing, improves when sitting or leaning forward consistent with neurogenic claudication.  Has stenosis at L4/5     Plan: -Patient has tried PT, Tylenol , lidocaine  patch, lumbar steroid injection, lyrica -Patient has now tried over 3 months of conservative treatment without relief of her pain including 6 weeks of physical therapy (first session on 04/11/2023, last session on 06/29/2023), so discussed operative management as a option.  After this discussion patient elected proceed with surgery -Patient will next be seen a date of surgery     The patient has symptoms consistent with neurogenic claudication. The patient's symptoms were not improving with conservative treatment so operative management was discussed in the form of L4/5 laminectomy. The risks including but not limited to iatrogenic instability, dural tear, nerve root injury, paralysis, persistent pain, infection, bleeding, heart attack, death, stroke, fracture, dvt/pe, and need for additional procedures were discussed with the patient. The benefit of the surgery would be improvement in the patient's radiating leg and buttock pain. I explained that back pain relief is not the goal of the surgery and it is not reliably alleviated with this surgery. The alternatives to surgical management were covered with the patient and included continued monitoring, physical therapy, over-the-counter pain medications, ambulatory aids, injections, and activity modification. All the patient's questions were answered to her satisfaction. After this discussion, the patient expressed understanding and elected to proceed with surgical intervention.      ___________________________________________________________________________     History:   Patient is a 73 y.o. female who presents today  for follow up on her lumbar spine.  Patient has now had over 4 months of low back pain that radiates into her bilateral lower extremities.  She feels it going into the buttocks and the posterior aspect of the thighs to the level of the knee.  She notes it improves when she sits or leans forward.  She said the pain is gotten more significant since she was last seen in the office.  She did get an injection and noticed temporary relief with that but then the pain returned.  She said she is limited in her ability to do simple activities like washing the dishes because of the pain.  She has not noticed any improvement with the conservative treatments tried so far.  She has not developed any new symptoms since she was last seen in the office.   Treatments tried: PT, Tylenol , lidocaine  patch, lumbar steroid injection, lyrica     Physical Exam:   General: no acute distress, appears stated age Neurologic: alert, answering questions appropriately, following commands Respiratory: unlabored breathing on room air, symmetric chest rise Psychiatric: appropriate affect, normal cadence to speech     MSK (spine):   -Strength exam                                                   Left                  Right EHL                              5/5  5/5 TA                                 5/5                  5/5 GSC                             5/5                  5/5 Knee extension            5/5                  5/5 Hip flexion                    5/5                  5/5   -Sensory exam                           Sensation intact to light touch in L2-S1 nerve distributions of bilateral lower extremities  Imaging: XRs of the lumbar spine from 05/25/2023 were previously independently reviewed and interpreted, showing grade 1 spondylolisthesis at L5/S1 that shifts about 1 mm between the supine MRI and the standing lateral film.  Disc height loss at L4/5.  Lumbar scoliosis with apex to the left at  L4.  Cobb angle of 14 degrees.  No fracture or dislocation seen.   MRI of the lumbar spine from 05/15/2023 was previously independently reviewed and interpreted, showing central and lateral recess stenosis at L4/5. Left sided foraminal stenosis and lateral recess stenosis at L5/S1.      Patient name: Vanessa [name] Patient MRN: [id number] Date of visit: 07/06/23  Pre-operative Scores  ODI: 42% VAS back: 9/10 VAS leg: 9/10

## 2023-07-13 ENCOUNTER — Encounter: Payer: Self-pay | Admitting: Orthopedic Surgery

## 2023-07-14 ENCOUNTER — Encounter: Payer: Self-pay | Admitting: Rehabilitative and Restorative Service Providers"

## 2023-07-14 ENCOUNTER — Ambulatory Visit (INDEPENDENT_AMBULATORY_CARE_PROVIDER_SITE_OTHER): Payer: Self-pay | Admitting: Rehabilitative and Restorative Service Providers"

## 2023-07-14 DIAGNOSIS — M79604 Pain in right leg: Secondary | ICD-10-CM | POA: Diagnosis not present

## 2023-07-14 DIAGNOSIS — R262 Difficulty in walking, not elsewhere classified: Secondary | ICD-10-CM | POA: Diagnosis not present

## 2023-07-14 DIAGNOSIS — M6281 Muscle weakness (generalized): Secondary | ICD-10-CM

## 2023-07-14 DIAGNOSIS — M5459 Other low back pain: Secondary | ICD-10-CM

## 2023-07-14 NOTE — Therapy (Addendum)
 OUTPATIENT PHYSICAL THERAPY TREATMENT / DISCHARGE   Patient Name: Vanessa [name] MRN: [id number] DOB:15-Jun-1950, 73 y.o., female Today's Date: 07/14/2023   END OF SESSION:  PT End of Session - 07/14/23 1052     Visit Number 7    Number of Visits 11    Date for PT Re-Evaluation 08/10/23    Authorization Type Medicare    Progress Note Due on Visit 11    PT Start Time 1055    PT Stop Time 1134    PT Time Calculation (min) 39 min    Activity Tolerance Patient limited by pain    Behavior During Therapy [facility] for tasks assessed/performed                  Past Medical History:  Diagnosis Date   Aortic valve sclerosis    Arthritis    osteoarthritis   Cancer (HCC)    endometrial   Chronic kidney disease    stage 3   Depression    GERD (gastroesophageal reflux disease)    Heart murmur    AV sclerosis; 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 but no obvious stenosis or regurgitation 12/04/17 echo ([facility])   Hepatitis    History of kidney stones    Hypertension    Hypothyroidism    PONV (postoperative nausea and vomiting)    Past Surgical History:  Procedure Laterality Date   ABDOMINAL HYSTERECTOMY  2008   BREAST SURGERY Right 06/20/2011   lumpectomy for atypical ductal hyperplasia   SHOULDER ARTHROSCOPY WITH ROTATOR CUFF REPAIR AND SUBACROMIAL DECOMPRESSION Right 01/29/2021   Procedure: RIGHT SHOULDER ARTHROSCOPY, DEBRIDEMENT, WITH MINI OPEN ROTATOR CUFF TEAR REPAIR & BICEPS TENODESIS;  Surgeon: Addie Cordella Hamilton, MD;  Location: [facility] OR;  Service: Orthopedics;  Laterality: Right;   TONSILLECTOMY     TUBAL LIGATION     Patient Active Problem List   Diagnosis Date Noted   Degenerative spondylolisthesis 04/25/2023   Neural foraminal stenosis of lumbar spine 04/25/2023   Complete tear of right rotator cuff    Biceps tendonitis on right    Degenerative superior labral anterior-to-posterior (SLAP) tear of right shoulder    Unilateral primary osteoarthritis, right  knee 05/13/2020    PCP: Hix, Oneil Lye MD  REFERRING PROVIDER: Barbarann Oneil BROCKS, MD  REFERRING DIAG: M54.50 (ICD-10-CM) - Acute bilateral low back pain, unspecified whether sciatica present  Rationale for Evaluation and Treatment: Rehabilitation  THERAPY DIAG:  Other low back pain  Pain in right leg  Muscle weakness (generalized)  Difficulty in walking, not elsewhere classified  ONSET DATE: 1 month ago   SUBJECTIVE:  SUBJECTIVE STATEMENT: Pt indicated having increased in symptoms with difficulty c movement in last week.  Reported really bad yesterday and not haivng improvement in symptoms.  Neuro MD indicated plan for surgery supported and Pt indicated waiting for insurance approval.   Pt indicated trying to take less Tylenol  due to kidneys but having more pain from that.    PERTINENT HISTORY:  Medical Hx: OA, Endometrial cancer, depression, GERD, Hepatitis, HTN, hypothyroidism  PAIN:  NPRS scale:  upon arrival 7/10, 8/10 upon waking.  Pain location: rt side of low back and into buttock Pain description: sharp, catching pain, achy, always there Aggravating factors: Going up and down stairs, standing for long periods of time Relieving factors: sitting down, pain medicine- asprin/tylenol    PRECAUTIONS: None  WEIGHT BEARING RESTRICTIONS: No  FALLS:  Has patient fallen in last 6 months? 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 Barbarann   OBJECTIVE:   DIAGNOSTIC FINDINGS:  AP lateral lumbar images are obtained and reviewed there is left lumbar  curvature less than 20 degrees.  Anterolisthesis L5-S1 with degenerative  facet changes grade 1.  Some aortic calcification noted.   Impression: Negative for acute changes.  Degenerative anterolisthesis  L5-S1.   PATIENT SURVEYS:  Patient-Specific Activity Scoring Scheme  0 represents "unable to perform." 10 represents "able to perform at prior level. 0 1 2 3 4 5 6 7 8 9  10 (Date and Score)   Activity 04/11/2023 04/25/2023 06/29/2023 07/14/2023  1. Long fast walk  3  3 0 0  2. Lifting heavy items   3  3 5 2   3. Playing golf 2 3 5  0  4. Walking up and down stairs 2 3 3  0  5. Standing for >5min 2 4 8 6   Score 2.4/10 3.2 4.2 avg 1.6 avg   Total score = sum of the activity scores/number of activities Minimum detectable change (90%CI) for average score = 2 points Minimum detectable change (90%CI) for single activity score = 3 points  SCREENING FOR RED FLAGS: 04/11/2023 Bowel or bladder incontinence: No Cauda equina syndrome: No  COGNITION: 04/11/2023 Overall cognitive status: WFL normal      SENSATION: 04/11/2023 WFL  MUSCLE LENGTH: 07/14/2023: Passive SLR Lt to 90 deg, Lt to 75 deg  04/11/2023 None tested  POSTURE:  04/11/2023 No Significant postural limitations  PALPATION: 04/19/2023: Trigger points with concordant localized symptoms Rt glute max/med  04/11/2023 Tender to areas of the R glute/ piriformis area Non tender to lower back musculature   LUMBAR ROM:  04/11/2023 Directional Preference Assessment: Centralization: non specific Peripheralization: non specific  AROM 04/11/2023 04/19/2023 05/01/2023 06/29/2023  Flexion Can reach toes without pain  To mid shin with mild painful arc upon return   Extension WFL 75% today 100 % WFL with improved  feelings 100% without complaint  Right lateral flexion WFL     Left lateral flexion WFL     Right rotation 75%     Left rotation WFL      (Blank rows = not tested)  LOWER EXTREMITY ROM:      Right eval Left eval  Hip flexion    Hip extension    Hip abduction    Hip adduction    Hip internal rotation    Hip external rotation    Knee flexion    Knee extension    Ankle dorsiflexion    Ankle plantarflexion    Ankle inversion    Ankle eversion     (Blank rows = not tested)  LOWER EXTREMITY MMT:    MMT Right 04/11/2023 Left 04/11/2023 Right 05/01/2023 Left 05/01/2023 Right 06/29/2023 Left 06/29/2023  Hip flexion     5/5 5/5  Hip extension 3- 3- 5/5 5/5    Hip abduction 4 5 4+/5 5/5    Hip adduction        Hip internal rotation        Hip external rotation        Knee flexion     5/5 5/5  Knee extension     5/5 5/5  Ankle dorsiflexion        Ankle plantarflexion        Ankle inversion        Ankle eversion         (Blank rows = not tested)  LUMBAR SPECIAL TESTS:  07/14/2023: Crossed SLR Lt leg created Rt buttock symptoms.  (+) Slump on Rt leg for tightness today    06/29/2023: (-) slump bilateral   04/11/2023 Not tested  FUNCTIONAL TESTS:  04/11/2023 Not tested  GAIT:  04/11/2023: ambulates without AD, no notable deviations                                                                                                                                                                                                                   TODAY'S TREATMENT:                                                                                                         DATE: 07/14/2023  Therex: Supine figure 4 pull towards 30 sec x 6 bilateral  Supine hooklying trunk rotation stretch 15 sec x 3 bilateral  Supine glute set 5 sec hold x 10  Supine clam shell with contralateral leg isometric hold focus x 20 bilateral blue band    Review of HEP and techniques.  Encouraged use as able  without pain worsening.    Gait Training [facility] education and use trial for offloading Rt leg due to pain symptoms.  Rational for placement in Lt hand.  Trial in clinic household distances with verbal cues for techniques during.  Performed < 150 ft several times for improved knowledge.   FWW trial as well c cues.    TODAY'S TREATMENT:                                                                                                         DATE: 05/01/2023  Therex: Nustep lvl 6 10 mins UE/LE  Supine lumbar trunk rotation stretch 15 sec x 3 bilateral Supine bridge 2 x 10 2-3 sec hold Supine figure 4 pull towards stretch 30 sec x 5  Standing lumbar extension AROM x 5 Review of existing HEP and updated techniques accordingly, handout provided.    RE-eval performed  TODAY'S TREATMENT:                                                                                                         DATE: 04/25/2023  Therex: Nustep Lvl 6 8 mins UE/LE  Leg press double leg 75 lbs x 15 , single leg 2 x 15 21 lbs performed bilaterally  Supine bridge 2 x 10 2-3 sec hold hold Supine figure 4 pull towards 15 sec x 5 bilateral   Manual: Percussive device to Rt QL and glute to patient tolerance.  Discussed purchase options for use at home while performing manual intervention.    Trigger Point Dry Needling Subsequent Treatment: Instructions provided previously at initial dry needling treatment.  Patient Verbal Consent Given: Yes Education Handout Provided: Previously Provided Muscles treated: Rt glute max Treatment response/outcome: local twitch response   PATIENT EDUCATION:  06/29/2023 Education details: HEP Person educated: Patient Education method: Programmer, multimedia, Demonstration, Verbal cues, and Handouts Education comprehension: verbalized understanding, returned demonstration, and verbal cues required  HOME EXERCISE PROGRAM: Access Code: [email or web address] URL: [email or web address] Date:  06/29/2023 Prepared by: Ozell Silvan  Exercises - Supine Lower Trunk Rotation  - 2-3 x daily - 7 x weekly - 1 sets - 3-5 reps - 15 hold - Supine Bridge  - 1-2 x daily - 7 x weekly - 1-2 sets - 10 reps - 2 hold - Clamshell  - 1-2 x daily - 7 x weekly - 2-3  sets - 10 reps - Standing Lumbar Extension with Counter  - 2-3 x daily - 7 x weekly - 1 sets - 5-10 reps - Prone Press Up  - 1-2 x daily - 7 x weekly - 1-2 sets - 10 reps - 1-2 hold - Seated Multifidi Isometric  - 1-2 x daily - 7 x weekly - 1 sets - 10 reps - 5-10 hold - Supine Figure 4 Piriformis Stretch with Leg Extension  - 2-3 x daily - 7 x weekly - 1 sets - 3-5 reps - 15-30 hold  ASSESSMENT:  CLINICAL IMPRESSION: Presentation today (and reported from last week) was worsening in symptoms and overall function.  Pt indicated taking less medicine to address kidney complications related to prolonged medicine use but that negatively impacted symptom presentation.  Patient specific functional scale was notably worse compared to eval and any updated measurements.  Noted antalgic gait with increased difficulty in progressive mobility upon arrival today.  Encouraged continued use of HEP that was not pain worsening.   At this time, support from physical therapy for MD plan for surgical intervention secondary to lack of progress and even times of worsened symptoms.  Clinical presentation indicated holding PT at this time due to lack of improvements/worsened symptoms.  Pt has been active in therapy for > 6 weeks with today being 7th visit.    OBJECTIVE IMPAIRMENTS: decreased activity tolerance, decreased balance, decreased endurance, decreased knowledge of condition, decreased mobility, difficulty walking, decreased strength, and pain.   ACTIVITY LIMITATIONS: carrying, lifting, bending, sitting, standing, squatting, stairs, and locomotion level  PARTICIPATION LIMITATIONS: meal prep, cleaning, laundry, driving, shopping, community activity, and  yard work  PERSONAL FACTORS: Time since onset of injury/illness/exacerbation and 1-2 comorbidities: see above are also affecting patient's functional outcome.   REHAB POTENTIAL: Good  CLINICAL DECISION MAKING: Stable/uncomplicated  EVALUATION COMPLEXITY: Low   GOALS: Goals reviewed with patient? Yes  SHORT TERM GOALS: (target date for Short term goals are 3 weeks 05/02/2023)  1. Patient will demonstrate independent use of home exercise program to maintain progress from in clinic treatments.  Goal status: Met   LONG TERM GOALS: (target dates for all long term goals are 6 weeks  08/10/2023 )   1. Patient will demonstrate/report pain at worst less than or equal to 2/10 to facilitate minimal limitation in daily activity secondary to pain symptoms.  Goal status: on going 07/14/2023   2. Patient will demonstrate independent use of home exercise program to facilitate ability to maintain/progress functional gains from skilled physical therapy services.  Goal status:on going 07/14/2023   3. Patient will demonstrate Patient specific functional scale avg > or = 8/10 to indicate reduced disability due to condition.   Goal status: on going 07/14/2023   4. Patient will demonstrate lumbar extension 100 % WFL s symptoms to facilitate upright standing, walking posture at PLOF s limitation.  Goal status: on going 07/14/2023   5.  Patient will demonstrate up and down a flight of stairs with single hand rail with reciprocal gait pattern s symptoms. Goal status: on going 07/14/2023   6.  Patient will demonstrate squatting to pick up moderate weight items without reproduction of pain Goal status: on going 07/14/2023   7.  patient will report walking long distances in the community with </=2/10 pain to return to PLOF  Goal Status: on going 07/14/2023  PLAN:  PT FREQUENCY: 1-2x/week  PT DURATION: 6 weeks  PLANNED INTERVENTIONS: Can include 02853- PT Re-evaluation, 97110-Therapeutic exercises, 97530-  Therapeutic activity, [id number]- Neuromuscular re-education, [phone]- Self Care, 02859- Manual therapy, [phone]- Gait training, [phone]- Orthotic Fit/training, [phone]- Canalith repositioning, [id number]- Aquatic Therapy, [phone]- Electrical stimulation (unattended), 97750 Physical performance testing, [phone]- Electrical stimulation (manual), [id number]- Vasopneumatic device, [id number]- Ultrasound, [id number]- Traction (mechanical), [id number]- Ionotophoresis 4mg /ml Dexamethasone , Patient/Family education, Balance training, Stair training, Taping, Dry Needling, Joint mobilization, Joint manipulation, Spinal manipulation, Spinal mobilization, Scar mobilization, Vestibular training, Visual/preceptual remediation/compensation, DME instructions, Cryotherapy, and Moist heat.  All performed as medically necessary.  All included unless contraindicated  PLAN FOR NEXT SESSION: Hold PT due to clinical worse presentation unless additional visit required for surgical approval.   Ozell Silvan, PT, DPT, OCS, ATC 07/14/23  11:35 AM   PHYSICAL THERAPY DISCHARGE SUMMARY  Visits from Start of Care: 7  Current functional level related to goals / functional outcomes: See note   Remaining deficits: See note   Education / Equipment: HEP  Patient goals were partially met. Patient is being discharged due to did not respond to therapy. Surgery performed.   Ozell Silvan, PT, DPT, OCS, ATC 09/25/23  2:44 PM

## 2023-07-17 ENCOUNTER — Telehealth: Payer: Self-pay

## 2023-07-17 ENCOUNTER — Telehealth: Payer: Self-pay | Admitting: *Deleted

## 2023-07-17 NOTE — Telephone Encounter (Signed)
   Pre-operative Risk Assessment    Patient Name: Vanessa [name]  DOB: 07-13-1950 MRN: [id number]   Date of last office visit: 04/04/23 with Dr. Loetta Ringer  Date of next office visit: 10/16/23 with Dr. Maximo Spar    Request for Surgical Clearance    Procedure:  L4-5 Laminectomy   Date of Surgery:  Clearance TBD                                Surgeon:  Not indicated  Surgeon's Group or Practice Name: [facility]  Phone number:  [phone] Fax number:  [phone] attn: April B   Type of Clearance Requested:   - Medical  - Pharmacy:  Hold Aspirin  not indicated    Type of Anesthesia:  General    Additional requests/questions:    Kenny Peals   07/17/2023, 9:00 AM

## 2023-07-17 NOTE — Telephone Encounter (Signed)
 Pt has tele preop appt 07/26/23. Med rec and consent are done.      Patient Consent for Virtual Visit        Vanessa [name] has provided verbal consent on 07/17/2023 for a virtual visit (video or telephone).   CONSENT FOR VIRTUAL VISIT FOR:  Vanessa [name]  By participating in this virtual visit I agree to the following:  I hereby voluntarily request, consent and authorize [facility] and its employed or contracted physicians, physician assistants, nurse practitioners or other licensed health care professionals (the Practitioner), to provide me with telemedicine health care services (the "Services") as deemed necessary by the treating Practitioner. I acknowledge and consent to receive the Services by the Practitioner via telemedicine. I understand that the telemedicine visit will involve communicating with the Practitioner through live audiovisual communication technology and the disclosure of certain medical information by electronic transmission. I acknowledge that I have been given the opportunity to request an in-person assessment or other available alternative prior to the telemedicine visit and am voluntarily participating in the telemedicine visit.  I understand that I have the right to withhold or withdraw my consent to the use of telemedicine in the course of my care at any time, without affecting my right to future care or treatment, and that the Practitioner or I may terminate the telemedicine visit at any time. I understand that I have the right to inspect all information obtained and/or recorded in the course of the telemedicine visit and may receive copies of available information for a reasonable fee.  I understand that some of the potential risks of receiving the Services via telemedicine include:  Delay or interruption in medical evaluation due to technological equipment failure or disruption; Information transmitted may not be sufficient (e.g. poor  resolution of images) to allow for appropriate medical decision making by the Practitioner; and/or  In rare instances, security protocols could fail, causing a breach of personal health information.  Furthermore, I acknowledge that it is my responsibility to provide information about my medical history, conditions and care that is complete and accurate to the best of my ability. I acknowledge that Practitioner's advice, recommendations, and/or decision may be based on factors not within their control, such as incomplete or inaccurate data provided by me or distortions of diagnostic images or specimens that may result from electronic transmissions. I understand that the practice of medicine is not an exact science and that Practitioner makes no warranties or guarantees regarding treatment outcomes. I acknowledge that a copy of this consent can be made available to me via my patient portal [facility] MyChart), or I can request a printed copy by calling the office of [facility].    I understand that my insurance will be billed for this visit.   I have read or had this consent read to me. I understand the contents of this consent, which adequately explains the benefits and risks of the Services being provided via telemedicine.  I have been provided ample opportunity to ask questions regarding this consent and the Services and have had my questions answered to my satisfaction. I give my informed consent for the services to be provided through the use of telemedicine in my medical care

## 2023-07-17 NOTE — Telephone Encounter (Signed)
   Name: Vanessa [name]  DOB: [birth year]  MRN: [id number]  Primary Cardiologist: Hazle Lites, MD   Preoperative team, please contact this patient and set up a phone call appointment for further preoperative risk assessment. Please obtain consent and complete medication review. Thank you for your help.  I confirm that guidance regarding antiplatelet and oral anticoagulation therapy has been completed and, if necessary, noted below.  Per office protocol, if patient is without any new symptoms or concerns at the time of their virtual visit, she may hold Aspirin  for 5-7 days prior to procedure. Please resume Aspirin  as soon as possible postprocedure, at the discretion of the surgeon.    I also confirmed the patient resides in the state of [place] . As per [facility] [employer] telemedicine laws, the patient must reside in the state in which the provider is licensed.   Ava Boatman, NP 07/17/2023, 10:49 AM [facility]

## 2023-07-17 NOTE — Telephone Encounter (Signed)
 Pt has tele preop appt 07/26/23. Med rec and consent are done.

## 2023-07-21 ENCOUNTER — Encounter: Admitting: Rehabilitative and Restorative Service Providers"

## 2023-07-24 ENCOUNTER — Encounter: Admitting: Rehabilitative and Restorative Service Providers"

## 2023-07-26 ENCOUNTER — Ambulatory Visit: Attending: Cardiology | Admitting: Student

## 2023-07-26 DIAGNOSIS — Z01810 Encounter for preprocedural cardiovascular examination: Secondary | ICD-10-CM | POA: Diagnosis not present

## 2023-07-26 NOTE — Progress Notes (Signed)
 Virtual Visit via Telephone Note   Because of Vanessa Vanessa's co-morbid illnesses, she is at least at moderate risk for complications without adequate follow up.  This format is felt to be most appropriate for this patient at this time.  The patient did not have access to video technology/had technical difficulties with video requiring transitioning to audio format only (telephone).  All issues noted in this document were discussed and addressed.  No physical exam could be performed with this format.  Please refer to the patient's chart for her consent to telehealth for [facility].  Evaluation Performed:  Preoperative cardiovascular risk assessment _____________   Date:  07/26/2023   Patient ID:  Vanessa Vanessa, DOB [birth year], MRN [id number] Patient Location:  Home Provider location:   Office  Primary Care Provider:  Geraldine [name] Primary Cardiologist:  Vanessa Lites, [name]  Chief Complaint / Patient Profile   73 y.o. y/o female with a h/o Coronary artery calcification, hypertension, hyperlipidemia, hypothyroidism, CKD stage IIIb who is pending L4-5 laminectomy by Ortho care in [facility] and presents Vanessa for telephonic preoperative cardiovascular risk assessment.  History of Present Illness    Vanessa Vanessa is a 73 y.o. female who presents via audio/video conferencing for a telehealth visit Vanessa.  Pt was last seen in cardiology clinic on 04/04/2023 by Vanessa Vanessa.  At that time Vanessa Vanessa was stable from a cardiac standpoint.  The patient is now pending procedure as outlined above. Since her last visit, she is doing well. Patient denies shortness of breath, dyspnea on exertion, lower extremity edema, orthopnea or PND. No chest pain, pressure, or tightness. No palpitations. Her activity has been limited in the last 2-3 weeks secondary to increased back pain. She is walking around her home for exercise with the support of a walker. She is  also performing light pilates. Up until the end of March she was participating in group fitness classes.   Past Medical History    Past Medical History:  Diagnosis Date   Aortic valve sclerosis    Arthritis    osteoarthritis   Cancer (HCC)    endometrial   Chronic kidney disease    stage 3   Depression    GERD (gastroesophageal reflux disease)    Heart murmur    AV sclerosis; 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 but no obvious stenosis or regurgitation 12/04/17 echo ([facility])   Hepatitis    History of kidney stones    Hypertension    Hypothyroidism    PONV (postoperative nausea and vomiting)    Past Surgical History:  Procedure Laterality Date   ABDOMINAL HYSTERECTOMY  2008   BREAST SURGERY Right 06/20/2011   lumpectomy for atypical ductal hyperplasia   SHOULDER ARTHROSCOPY WITH ROTATOR CUFF REPAIR AND SUBACROMIAL DECOMPRESSION Right 01/29/2021   Procedure: RIGHT SHOULDER ARTHROSCOPY, DEBRIDEMENT, WITH MINI OPEN ROTATOR CUFF TEAR REPAIR & BICEPS TENODESIS;  Surgeon: Vanessa Mesi, [name];  Location: [facility] OR;  Service: Orthopedics;  Laterality: Right;   TONSILLECTOMY     TUBAL LIGATION      Allergies  Allergies  Allergen Reactions   Lisinopril Cough    Other Reaction(s): cough   Morphine     GI Upset (intolerance)  Other Reaction(s): GI Intolerance, vomiting   Codeine     Upset stomach    Home Medications    Prior to Admission medications   Medication Sig Start Date End Date Taking? Authorizing Provider  acetaminophen  (TYLENOL ) 650 MG CR tablet Take  650 mg by mouth 2 (two) times daily as needed for pain.    Provider, Historical, [name]  atorvastatin  (LIPITOR) 80 MG tablet Take 1 tablet (80 mg total) by mouth daily. 01/26/23   Vanessa Vanessa  [facility] ASPIRIN  ADULT LOW DOSE 81 MG chewable tablet CHEW 1 TABLET BY MOUTH DAILY. 03/03/21   Magnant, Vanessa Vanessa  Cyanocobalamin (B-12 PO) Take 1 capsule by mouth 3 (three) times a week.    Provider, Historical, [name]   ezetimibe  (ZETIA ) 10 MG tablet Take 1 tablet (10 mg total) by mouth daily. 01/17/23 07/17/23  Vanessa Vanessa  famotidine (PEPCID) 20 MG tablet Take 20 mg by mouth daily.    Provider, Historical, [name]  furosemide (LASIX) 20 MG tablet Take 20 mg by mouth daily as needed for edema. 10/04/20   Provider, Historical, [name]  hydrALAZINE (APRESOLINE) 100 MG tablet Take 100 mg by mouth 3 (three) times daily. 11/03/20   Provider, Historical, [name]  levothyroxine (SYNTHROID) 150 MCG tablet TAKE 1 TABLET EVERY DAY  AT  6AM 02/17/20   Provider, Historical, [name]  loratadine (CLARITIN) 10 MG tablet Take 10 mg by mouth daily as needed for allergies.    Provider, Historical, [name]  metoprolol  succinate (TOPROL  XL) 25 MG 24 hr tablet Take 1 tablet (25 mg total) by mouth daily. 01/26/23   Vanessa Vanessa  mirtazapine (REMERON) 15 MG tablet Take 15 mg by mouth at bedtime. 04/13/20   Provider, Historical, [name]  Multiple Vitamins-Minerals (HAIR SKIN AND NAILS FORMULA) TABS Take 1-2 tablets by mouth See admin instructions. Take 1 tablet on days when taking the b12 and 2 tablets on the non-b12 days    Provider, Historical, [name]  NIFEdipine (PROCARDIA XL/NIFEDICAL XL) 60 MG 24 hr tablet Take 1 tablet by mouth 2 (two) times daily. 10/27/21   Provider, Historical, [name]  pregabalin (LYRICA) 50 MG capsule Take 50 mg by mouth at bedtime.    Provider, Historical, [name]  sodium bicarbonate 650 MG tablet Take 650 mg by mouth 2 (two) times daily. 10/27/21   Provider, Historical, [name]  venlafaxine XR (EFFEXOR-XR) 37.5 MG 24 hr capsule Take 37.5 mg by mouth daily. 02/17/20   Provider, Historical, [name]  VITAMIN D PO Take 1 capsule by mouth 3 (three) times a week.    Provider, Historical, [name]    Physical Exam    Vital Signs:  Vanessa Vanessa.  Given telephonic nature of communication, physical exam is limited. AAOx3. NAD. Normal affect.  Speech and respirations are unlabored.   Assessment & Plan     Primary Cardiologist: Vanessa Lites, [name]  Preoperative cardiovascular risk assessment.  L4-5 laminectomy by Ortho care at [facility].  Chart reviewed as part of pre-operative protocol coverage. According to the RCRI, patient has a 0.4-3.9% risk of MACE. Patient reports activity equivalent to 4.0 METS (walking around home and gentle pilates).   Given past medical history and time since last visit, based on ACC/AHA guidelines, Micaella Gitto would be at acceptable risk for the planned procedure without further cardiovascular testing.   Patient was advised that if she develops new symptoms prior to surgery to contact our office to arrange a follow-up appointment.  she verbalized understanding.  Ideally aspirin  should be continued without interruption, however if the bleeding risk is too great, aspirin  may be held for 5-7 days prior to surgery. Please resume aspirin  post operatively when it is felt to be safe  from a bleeding standpoint.    I will route this recommendation to the requesting party via Epic fax function.  Please call with questions.  Time:   Vanessa, I have spent 5 minutes with the patient with telehealth technology discussing medical history, symptoms, and management plan.     Morey Ar, NP  07/26/2023, 8:04 AM

## 2023-07-31 ENCOUNTER — Encounter: Admitting: Rehabilitative and Restorative Service Providers"

## 2023-08-23 ENCOUNTER — Encounter (HOSPITAL_COMMUNITY): Payer: Self-pay | Admitting: *Deleted

## 2023-08-23 ENCOUNTER — Ambulatory Visit (HOSPITAL_COMMUNITY)
Admission: EM | Admit: 2023-08-23 | Discharge: 2023-08-23 | Disposition: A | Discharge status: Home / Self Care | Attending: Internal Medicine | Admitting: Internal Medicine

## 2023-08-23 DIAGNOSIS — G5601 Carpal tunnel syndrome, right upper limb: Secondary | ICD-10-CM

## 2023-08-23 HISTORY — DX: Dorsalgia, unspecified: M54.9

## 2023-08-23 NOTE — ED Triage Notes (Signed)
 C/O noticing swelling to right hand at base of thumb onset approx 1 wk ago without any known injury. States she felt it may be R/T using her Kindl, so she stopped. Now having swelling in right little finger and other areas of hand as well.

## 2023-08-23 NOTE — Discharge Instructions (Signed)
 I suspect that your pain is due to carpal tunnel syndrome. As we discussed, I recommend wearing a wrist brace nightly. Please see the attached information about carpal tunnel syndrome. Follow up with your primary care doctor or sports medicine if symptoms do not improve.

## 2023-08-23 NOTE — ED Provider Notes (Signed)
 [facility]    CSN: [id number] Arrival date & time: 08/23/23  1111      History   Chief Complaint Chief Complaint  Patient presents with   Hand Problem    HPI Vanessa [name] is a 73 y.o. female who presents today for evaluation of right hand pain and swelling x 1 week.  Symptoms began 1 week ago.  No inciting event or trauma noted.  She reports a history of Kindl hand that she previously developed.  She stopped using her Kindle but states that pain did not improve.  She has also noted swelling at the base of her thumb and the lateral aspect of the fifth digit.  She describes pain with numbness along the middle finger that wakes her from sleep.  Of note, she says that swelling at the base of her thumb has started to improve since onset earlier this week.  Past Medical History:  Diagnosis Date   Aortic valve sclerosis    Arthritis    osteoarthritis   Back pain    Cancer (HCC)    endometrial   Chronic kidney disease    stage 3   Depression    GERD (gastroesophageal reflux disease)    Heart murmur    AV sclerosis; 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 but no obvious stenosis or regurgitation 12/04/17 echo ([facility])   Hepatitis    History of kidney stones    Hypertension    Hypothyroidism    PONV (postoperative nausea and vomiting)     Patient Active Problem List   Diagnosis Date Noted   Degenerative spondylolisthesis 04/25/2023   Neural foraminal stenosis of lumbar spine 04/25/2023   Complete tear of right rotator cuff    Biceps tendonitis on right    Degenerative superior labral anterior-to-posterior (SLAP) tear of right shoulder    Unilateral primary osteoarthritis, right knee 05/13/2020    Past Surgical History:  Procedure Laterality Date   ABDOMINAL HYSTERECTOMY  2008   BREAST SURGERY Right 06/20/2011   lumpectomy for atypical ductal hyperplasia   SHOULDER ARTHROSCOPY WITH ROTATOR CUFF REPAIR AND SUBACROMIAL DECOMPRESSION Right 01/29/2021    Procedure: RIGHT SHOULDER ARTHROSCOPY, DEBRIDEMENT, WITH MINI OPEN ROTATOR CUFF TEAR REPAIR & BICEPS TENODESIS;  Surgeon: Addie Cordella Hamilton, MD;  Location: [facility] OR;  Service: Orthopedics;  Laterality: Right;   TONSILLECTOMY     TUBAL LIGATION      OB History   No obstetric history on file.      Home Medications    Prior to Admission medications   Medication Sig Start Date End Date Taking? Authorizing Provider  atorvastatin  (LIPITOR) 80 MG tablet Take 1 tablet (80 mg total) by mouth daily. 01/26/23  Yes Burnard Debby LABOR, MD  [facility] ASPIRIN  ADULT LOW DOSE 81 MG chewable tablet CHEW 1 TABLET BY MOUTH DAILY. 03/03/21  Yes Magnant, [name] L, PA-C  Cyanocobalamin (B-12 PO) Take 1 capsule by mouth 3 (three) times a week.   Yes [provider]  famotidine (PEPCID) 20 MG tablet Take 20 mg by mouth daily.   Yes [provider]  furosemide (LASIX) 20 MG tablet Take 20 mg by mouth daily as needed for edema. 10/04/20  Yes [provider]  hydrALAZINE (APRESOLINE) 100 MG tablet Take 100 mg by mouth 3 (three) times daily. 11/03/20  Yes [provider]  levothyroxine (SYNTHROID) 150 MCG tablet TAKE 1 TABLET EVERY DAY  AT  6AM 02/17/20  Yes [provider]  metoprolol  succinate (TOPROL  XL) 25 MG  24 hr tablet Take 1 tablet (25 mg total) by mouth daily. 01/26/23  Yes Burnard Debby LABOR, MD  mirtazapine (REMERON) 15 MG tablet Take 15 mg by mouth at bedtime. 04/13/20  Yes [provider]  Multiple Vitamins-Minerals (HAIR SKIN AND NAILS FORMULA) TABS Take 1-2 tablets by mouth See admin instructions. Take 1 tablet on days when taking the b12 and 2 tablets on the non-b12 days   Yes [provider]  NIFEdipine (PROCARDIA XL/NIFEDICAL XL) 60 MG 24 hr tablet Take 1 tablet by mouth 2 (two) times daily. 10/27/21  Yes [provider]  pregabalin (LYRICA) 50 MG capsule Take 50 mg by mouth at bedtime.   Yes [provider]  TRAMADOL HCL PO Take by mouth.  Takes approx every other day for back pain   Yes [provider]  venlafaxine XR (EFFEXOR-XR) 37.5 MG 24 hr capsule Take 37.5 mg by mouth daily. 02/17/20  Yes [provider]  VITAMIN D PO Take 1 capsule by mouth 3 (three) times a week.   Yes [provider]  acetaminophen  (TYLENOL ) 650 MG CR tablet Take 650 mg by mouth 2 (two) times daily as needed for pain.    [provider]  ezetimibe  (ZETIA ) 10 MG tablet Take 1 tablet (10 mg total) by mouth daily. 01/17/23 07/17/23  Burnard Debby LABOR, MD  loratadine (CLARITIN) 10 MG tablet Take 10 mg by mouth daily as needed for allergies.    [provider]  sodium bicarbonate 650 MG tablet Take 650 mg by mouth 2 (two) times daily. 10/27/21   [provider]    Family History History reviewed. No pertinent family history.  Social History Social History   Tobacco Use   Smoking status: Former    Types: Cigarettes   Smokeless tobacco: Never  Vaping Use   Vaping status: Never Used  Substance Use Topics   Alcohol use: Yes    Comment: 1-2 drinks/wk when not taking Tramadol   Drug use: Never   Allergies   Lisinopril, Morphine, and Codeine   Review of Systems Review of Systems  Musculoskeletal:        Right hand pain with swelling at the base of the thumb and lateral portion of the fifth digit   Physical Exam Triage Vital Signs ED Triage Vitals  Encounter Vitals Group     BP 08/23/23 1144 (!) 167/74     Girls Systolic BP Percentile --      Girls Diastolic BP Percentile --      Boys Systolic BP Percentile --      Boys Diastolic BP Percentile --      Pulse Rate 08/23/23 1144 80     Resp 08/23/23 1144 16     Temp 08/23/23 1144 98 F (36.7 C)     Temp Source 08/23/23 1144 Oral     SpO2 08/23/23 1144 97 %     Weight --      Height --      Head Circumference --      Peak Flow --      Pain Score 08/23/23 1147 4     Pain Loc --      Pain Education --      Exclude from Growth Chart --     No data found.  Updated Vital Signs BP (!) 167/74   Pulse 80   Temp 98 F (36.7 C) (Oral)   Resp 16   SpO2 97%   Physical Exam Vitals reviewed.  Constitutional:      General: She is not in acute distress.    Appearance: Normal appearance. She is not toxic-appearing.  Musculoskeletal:        General: Swelling present. No tenderness.     Comments: Mild edema is present over the thenar eminence as well as the lateral aspect of the fifth digit of the right hand.  ROM generally intact.  There is no tenderness to palpation CMC joint.  Grip strength in the right hand is mildly reduced compared to the left.  Positive Phalen's in the right hand.  Negative Tinel's.  The right hand is neurovascularly intact.  Neurological:     Mental Status: She is alert.    UC Treatments / Results  Labs (all labs ordered are listed, but only abnormal results are displayed) Labs Reviewed - No data to display  EKG   Radiology No results found.  Procedures Procedures (including critical care time)  Medications Ordered in UC Medications - No data to display  Initial Impression / Assessment and Plan / UC Course  I have reviewed the triage vital signs and the nursing notes.  Pertinent labs & imaging results that were available during my care of the patient were reviewed by me and considered in my medical decision making (see chart for details).  Patient presents today for evaluation of right hand pain and swelling x 1 week.  Pain is worse at night.  There was no inciting event or trauma prior to the onset of symptoms.  Her exam today is significant for mild edema present over the thenar eminence as well as the lateral aspect of the fifth digit.  Grip strength is reduced.  Positive Phalen's.  History and exam findings are concerning for carpal tunnel syndrome of the right wrist.  Treatment options reviewed.  Recommend daily use of a cock up wrist splint, which she has previously purchased on [place]  but is not using currently.  No medications were prescribed today.  Recommend follow-up with her PCP or sports medicine if symptoms worsen or fail to improve.  Disposition: Stable for discharge home   Final Clinical Impressions(s) / UC Diagnoses   Final diagnoses:  Carpal tunnel syndrome of right wrist     Discharge Instructions      I suspect that your pain is due to carpal tunnel syndrome. As we discussed, I recommend wearing a wrist brace nightly. Please see the attached information about carpal tunnel syndrome. Follow up with your primary care doctor or sports medicine if symptoms do not improve.     ED Prescriptions   None    PDMP not reviewed this encounter.   Melvenia Manus BRAVO, MD 08/23/23 1229

## 2023-08-30 NOTE — Progress Notes (Signed)
 Surgical Instructions   Your procedure is scheduled on September 08, 2023. Report to [facility] Main Entrance A at 5:30 A.M., then check in with the Admitting office. Any questions or running late day of surgery: call [phone]  Questions prior to your surgery date: call [phone], Monday-Friday, 8am-4pm. If you experience any cold or flu symptoms such as cough, fever, chills, shortness of breath, etc. between now and your scheduled surgery, please notify us  at the above number.     Remember:  Do not eat after midnight the night before your surgery  You may drink clear liquids until 4:30 the morning of your surgery.   Clear liquids allowed are: Water, Non-Citrus Juices (without pulp), Carbonated Beverages, Clear Tea (no milk, honey, etc.), Black Coffee Only (NO MILK, CREAM OR POWDERED CREAMER of any kind), and Gatorade. Patient Instructions  The night before surgery:  No food after midnight. ONLY clear liquids after midnight  The day of surgery (if you do NOT have diabetes):  Drink ONE (1) Pre-Surgery Clear Ensure by 4:30 the morning of surgery. Drink in one sitting. Do not sip.  This drink was given to you during your hospital  pre-op appointment visit.  Nothing else to drink after completing the  Pre-Surgery Clear Ensure.          If you have questions, please contact your surgeon's office.    Take these medicines the morning of surgery with A SIP OF WATER  atorvastatin  (LIPITOR)  ezetimibe  (ZETIA )  famotidine (PEPCID)  hydrALAZINE (APRESOLINE)  levothyroxine (SYNTHROID)  metoprolol  succinate (TOPROL  XL)  NIFEdipine (PROCARDIA XL/NIFEDICAL XL)  venlafaxine XR (EFFEXOR-XR)   May take these medicines IF NEEDED: acetaminophen  (TYLENOL )  loratadine (CLARITIN)  traMADol (ULTRAM)    One week prior to surgery, STOP taking any Aspirin  (unless otherwise instructed by your surgeon) Aleve, Naproxen, Ibuprofen, Motrin, Advil, Goody's, BC's, all herbal medications, fish oil,  and non-prescription vitamins.                     Do NOT Smoke (Tobacco/Vaping) for 24 hours prior to your procedure.  If you use a CPAP at night, you may bring your mask/headgear for your overnight stay.   You will be asked to remove any contacts, glasses, piercing's, hearing aid's, dentures/partials prior to surgery. Please bring cases for these items if needed.    Patients discharged the day of surgery will not be allowed to drive home, and someone needs to stay with them for 24 hours.  SURGICAL WAITING ROOM VISITATION Patients may have no more than 2 support people in the waiting area - these visitors may rotate.   Pre-op nurse will coordinate an appropriate time for 1 ADULT support person, who may not rotate, to accompany patient in pre-op.  Children under the age of 74 must have an adult with them who is not the patient and must remain in the main waiting area with an adult.  If the patient needs to stay at the hospital during part of their recovery, the visitor guidelines for inpatient rooms apply.  Please refer to the [facility] website for the visitor guidelines for any additional information.   If you received a COVID test during your pre-op visit  it is requested that you wear a mask when out in public, stay away from anyone that may not be feeling well and notify your surgeon if you develop symptoms. If you have been in contact with anyone that has tested positive in the last 10  days please notify you surgeon.      Pre-operative 5 CHG Bathing Instructions   You can play a key role in reducing the risk of infection after surgery. Your skin needs to be as free of germs as possible. You can reduce the number of germs on your skin by washing with CHG (chlorhexidine  gluconate) soap before surgery. CHG is an antiseptic soap that kills germs and continues to kill germs even after washing.   DO NOT use if you have an allergy to chlorhexidine /CHG or antibacterial soaps. If your  skin becomes reddened or irritated, stop using the CHG and notify one of our RNs at [phone].   Please shower with the CHG soap starting 4 days before surgery using the following schedule:     Please keep in mind the following:  DO NOT shave, including legs and underarms, starting the day of your first shower.   You may shave your face at any point before/day of surgery.  Place clean sheets on your bed the day you start using CHG soap. Use a clean washcloth (not used since being washed) for each shower. DO NOT sleep with pets once you start using the CHG.   CHG Shower Instructions:  Wash your face and private area with normal soap. If you choose to wash your hair, wash first with your normal shampoo.  After you use shampoo/soap, rinse your hair and body thoroughly to remove shampoo/soap residue.  Turn the water OFF and apply about 3 tablespoons (45 ml) of CHG soap to a CLEAN washcloth.  Apply CHG soap ONLY FROM YOUR NECK DOWN TO YOUR TOES (washing for 3-5 minutes)  DO NOT use CHG soap on face, private areas, open wounds, or sores.  Pay special attention to the area where your surgery is being performed.  If you are having back surgery, having someone wash your back for you may be helpful. Wait 2 minutes after CHG soap is applied, then you may rinse off the CHG soap.  Pat dry with a clean towel  Put on clean clothes/pajamas   If you choose to wear lotion, please use ONLY the CHG-compatible lotions that are listed below.  Additional instructions for the day of surgery: DO NOT APPLY any lotions, deodorants, cologne, or perfumes.   Do not bring valuables to the hospital. [facility] is not responsible for any belongings/valuables. Do not wear nail polish, gel polish, artificial nails, or any other type of covering on natural nails (fingers and toes) Do not wear jewelry or makeup Put on clean/comfortable clothes.  Please brush your teeth.  Ask your nurse before applying any  prescription medications to the skin.     CHG Compatible Lotions   Aveeno Moisturizing lotion  Cetaphil Moisturizing Cream  Cetaphil Moisturizing Lotion  Clairol Herbal Essence Moisturizing Lotion, Dry Skin  Clairol Herbal Essence Moisturizing Lotion, Extra Dry Skin  Clairol Herbal Essence Moisturizing Lotion, Normal Skin  Curel Age Defying Therapeutic Moisturizing Lotion with Alpha Hydroxy  Curel Extreme Care Body Lotion  Curel Soothing Hands Moisturizing Hand Lotion  Curel Therapeutic Moisturizing Cream, Fragrance-Free  Curel Therapeutic Moisturizing Lotion, Fragrance-Free  Curel Therapeutic Moisturizing Lotion, Original Formula  Eucerin Daily Replenishing Lotion  Eucerin Dry Skin Therapy Plus Alpha Hydroxy Crme  Eucerin Dry Skin Therapy Plus Alpha Hydroxy Lotion  Eucerin Original Crme  Eucerin Original Lotion  Eucerin Plus Crme Eucerin Plus Lotion  Eucerin TriLipid Replenishing Lotion  Keri Anti-Bacterial Hand Lotion  Keri Deep Conditioning Original Lotion Dry Skin Formula Softly  Scented  Keri Deep Conditioning Original Lotion, Fragrance Free Sensitive Skin Formula  Keri Lotion Fast Absorbing Fragrance Free Sensitive Skin Formula  Keri Lotion Fast Absorbing Softly Scented Dry Skin Formula  Keri Original Lotion  Keri Skin Renewal Lotion Keri Silky Smooth Lotion  Keri Silky Smooth Sensitive Skin Lotion  Nivea Body Creamy Conditioning Oil  Nivea Body Extra Enriched Lotion  Nivea Body Original Lotion  Nivea Body Sheer Moisturizing Lotion Nivea Crme  Nivea Skin Firming Lotion  NutraDerm 30 Skin Lotion  NutraDerm Skin Lotion  NutraDerm Therapeutic Skin Cream  NutraDerm Therapeutic Skin Lotion  ProShield Protective Hand Cream  Provon moisturizing lotion  Please read over the following fact sheets that you were given.

## 2023-08-31 ENCOUNTER — Encounter (HOSPITAL_COMMUNITY)
Admission: RE | Admit: 2023-08-31 | Discharge: 2023-08-31 | Disposition: A | Discharge status: Home / Self Care | Source: Ambulatory Visit | Attending: Orthopedic Surgery | Admitting: Orthopedic Surgery

## 2023-08-31 ENCOUNTER — Encounter (HOSPITAL_COMMUNITY): Payer: Self-pay

## 2023-08-31 ENCOUNTER — Other Ambulatory Visit: Payer: Self-pay

## 2023-08-31 DIAGNOSIS — Z7982 Long term (current) use of aspirin: Secondary | ICD-10-CM | POA: Diagnosis not present

## 2023-08-31 DIAGNOSIS — Z79899 Other long term (current) drug therapy: Secondary | ICD-10-CM | POA: Diagnosis not present

## 2023-08-31 DIAGNOSIS — I129 Hypertensive chronic kidney disease with stage 1 through stage 4 chronic kidney disease, or unspecified chronic kidney disease: Secondary | ICD-10-CM | POA: Diagnosis not present

## 2023-08-31 DIAGNOSIS — K219 Gastro-esophageal reflux disease without esophagitis: Secondary | ICD-10-CM | POA: Insufficient documentation

## 2023-08-31 DIAGNOSIS — E039 Hypothyroidism, unspecified: Secondary | ICD-10-CM | POA: Diagnosis not present

## 2023-08-31 DIAGNOSIS — Z01812 Encounter for preprocedural laboratory examination: Secondary | ICD-10-CM | POA: Diagnosis not present

## 2023-08-31 DIAGNOSIS — Z01818 Encounter for other preprocedural examination: Secondary | ICD-10-CM | POA: Diagnosis present

## 2023-08-31 DIAGNOSIS — I342 Nonrheumatic mitral (valve) stenosis: Secondary | ICD-10-CM | POA: Diagnosis not present

## 2023-08-31 DIAGNOSIS — E785 Hyperlipidemia, unspecified: Secondary | ICD-10-CM | POA: Diagnosis not present

## 2023-08-31 DIAGNOSIS — Z87891 Personal history of nicotine dependence: Secondary | ICD-10-CM | POA: Diagnosis not present

## 2023-08-31 DIAGNOSIS — N183 Chronic kidney disease, stage 3 unspecified: Secondary | ICD-10-CM | POA: Diagnosis not present

## 2023-08-31 DIAGNOSIS — I2510 Atherosclerotic heart disease of native coronary artery without angina pectoris: Secondary | ICD-10-CM | POA: Insufficient documentation

## 2023-08-31 LAB — CBC
HCT: 37.5 % (ref 36.0–46.0)
Hemoglobin: 12.3 g/dL (ref 12.0–15.0)
MCH: 28.7 pg (ref 26.0–34.0)
MCHC: 32.8 g/dL (ref 30.0–36.0)
MCV: 87.6 fL (ref 80.0–100.0)
Platelets: 398 K/uL (ref 150–400)
RBC: 4.28 MIL/uL (ref 3.87–5.11)
RDW: 12.4 % (ref 11.5–15.5)
WBC: 11.6 K/uL — ABNORMAL HIGH (ref 4.0–10.5)
nRBC: 0 % (ref 0.0–0.2)

## 2023-08-31 LAB — BASIC METABOLIC PANEL WITH GFR
Anion gap: 9 (ref 5–15)
BUN: 30 mg/dL — ABNORMAL HIGH (ref 8–23)
CO2: 24 mmol/L (ref 22–32)
Calcium: 9.9 mg/dL (ref 8.9–10.3)
Chloride: 103 mmol/L (ref 98–111)
Creatinine, Ser: 1.67 mg/dL — ABNORMAL HIGH (ref 0.44–1.00)
GFR, Estimated: 32 mL/min — ABNORMAL LOW (ref >60)
Glucose, Bld: 133 mg/dL — ABNORMAL HIGH (ref 70–99)
Potassium: 4.9 mmol/L (ref 3.5–5.1)
Sodium: 136 mmol/L (ref 135–145)

## 2023-08-31 LAB — SURGICAL PCR SCREEN
MRSA, PCR: POSITIVE — AB
Staphylococcus aureus: POSITIVE — AB

## 2023-08-31 NOTE — Progress Notes (Signed)
 PCP - Areta Sari COME Cardiologist - Vinie Hilty,MD Nephrologist - Jobira Woldemichael,MD  PPM/ICD - denies Device Orders -  Rep Notified -   Chest x-ray - na EKG - 04/04/23 Stress Test - denies ECHO - 12/27/22 Cardiac Cath - denies  Sleep Study - denies CPAP -   Fasting Blood Sugar - na Checks Blood Sugar _____ times a day  Last dose of GLP1 agonist-  na GLP1 instructions:   Blood Thinner Instructions:na  Aspirin  Instructions:per cardiology aspirin  may beheld 5-7 days. Pt states her last dose will be 08/31/23.  ERAS Protcol - clears until 0430 PRE-SURGERY Ensure or G2- Ensure  COVID TEST- na   Anesthesia review: yes- coronary artery disease,CKD cardiac clearance.   Patient denies shortness of breath, fever, cough and chest pain at PAT appointment   All instructions explained to the patient, with a verbal understanding of the material. Patient agrees to go over the instructions while at home for a better understanding. Patient also instructed to self quarantine after being tested for COVID-19. The opportunity to ask questions was provided.

## 2023-09-01 ENCOUNTER — Ambulatory Visit (INDEPENDENT_AMBULATORY_CARE_PROVIDER_SITE_OTHER): Admitting: Surgical

## 2023-09-01 ENCOUNTER — Other Ambulatory Visit: Payer: Self-pay

## 2023-09-01 DIAGNOSIS — G5601 Carpal tunnel syndrome, right upper limb: Secondary | ICD-10-CM

## 2023-09-01 DIAGNOSIS — M79641 Pain in right hand: Secondary | ICD-10-CM

## 2023-09-01 NOTE — Anesthesia Preprocedure Evaluation (Addendum)
 Anesthesia Evaluation  Patient identified by MRN, date of birth, ID band Patient awake    Reviewed: Allergy & Precautions, NPO status , Patient's Chart, lab work & pertinent test results  History of Anesthesia Complications (+) PONV and history of anesthetic complications  Airway Mallampati: I  TM Distance: >3 FB Neck ROM: Full    Dental no notable dental hx. (+) Teeth Intact, Dental Advisory Given   Pulmonary former smoker   Pulmonary exam normal breath sounds clear to auscultation       Cardiovascular hypertension, Pt. on medications Normal cardiovascular exam Rhythm:Regular Rate:Normal  01/13/21 EKG SR R77  Echo 12/04/17 ([facility]): SUMMARY  The left ventricular size is normal.   Left ventricular systolic function is normal.  LV ejection fraction = 65-70%.     Neuro/Psych  PSYCHIATRIC DISORDERS  Depression    negative neurological ROS     GI/Hepatic ,GERD  Medicated and Controlled,,  Endo/Other  Hypothyroidism    Renal/GU Renal InsufficiencyRenal diseaseCKD Stage 3     Hx of endometrial CA    Musculoskeletal  (+) Arthritis ,    Abdominal   Peds  Hematology   Anesthesia Other Findings All: codeine Lisinopril Morphine  Reproductive/Obstetrics                              Anesthesia Physical Anesthesia Plan  ASA: 3  Anesthesia Plan: General   Post-op Pain Management: Ofirmev  IV (intra-op) and Ketamine IV*   Induction: Intravenous  PONV Risk Score and Plan: 4 or greater and Scopolamine  patch - Pre-op, Midazolam , Dexamethasone , Ondansetron  and Treatment may vary due to age or medical condition  Airway Management Planned: Oral ETT  Additional Equipment: None  Intra-op Plan:   Post-operative Plan: Extubation in OR  Informed Consent: I have reviewed the patients History and Physical, chart, labs and discussed the procedure including the risks, benefits and alternatives  for the proposed anesthesia with the patient or authorized representative who has indicated his/her understanding and acceptance.     Dental advisory given  Plan Discussed with: Anesthesiologist and CRNA  Anesthesia Plan Comments: (PAT note by Lynwood Hope, PA-C: 73 year old female follows with cardiology for history of coronary artery calcifications, HTN, HLD, mild mitral stenosis.  Seen by Barnie Press, NP on 07/26/2023 for preop evaluation.  Per note. Chart reviewed as part of pre-operative protocol coverage. According to the RCRI, patient has a 0.4-3.9% risk of MACE. Patient reports activity equivalent to 4.0 METS (walking around home and gentle pilates). Given past medical history and time since last visit, based on ACC/AHA guidelines, Vanessa [name] would be at acceptable risk for the planned procedure without further cardiovascular testing. Patient was advised that if she develops new symptoms prior to surgery to contact our office to arrange a follow-up appointment.  she verbalized understanding. Ideally aspirin  should be continued without interruption, however if the bleeding risk is too great, aspirin  may be held for 5-7 days prior to surgery. Please resume aspirin  post operatively when it is felt to be safe from a bleeding standpoint.  Other pertinent history includes former smoker, GERD on H2 blocker, CKD 3, hypothyroid.  Preop labs reviewed, creatinine elevated 1.67 consistent with history of CKD, otherwise unremarkable.  EKG 04/04/2023: NSR.  Rate 61.  TTE 12/27/2022: 1. Left ventricular ejection fraction, by estimation, is 55 to 60%. The  left ventricle has normal function. The left ventricle has no regional  wall motion abnormalities. Left ventricular  diastolic parameters are  consistent with Grade I diastolic  dysfunction (impaired relaxation). Elevated left atrial pressure.   2. Right ventricular systolic function is normal. The right ventricular  size is  mildly enlarged. Tricuspid regurgitation signal is inadequate for  assessing PA pressure.   3. Left atrial size was moderately dilated.   4. The mitral valve is degenerative. Trivial mitral valve regurgitation.  Mild mitral stenosis. The mean mitral valve gradient is 3.8 mmHg with  average heart rate of 59 bpm. Severe mitral annular calcification.   5. The aortic valve is tricuspid. There is mild calcification of the  aortic valve. There is mild thickening of the aortic valve. Aortic valve  regurgitation is not visualized. Aortic valve sclerosis/calcification is  present, without any evidence of aortic stenosis.  )         Anesthesia Quick Evaluation

## 2023-09-01 NOTE — Progress Notes (Signed)
 Anesthesia Chart Review:  73 year old female follows with cardiology for history of coronary artery calcifications, HTN, HLD, mild mitral stenosis.  Seen by Barnie Press, NP on 07/26/2023 for preop evaluation.  Per note. Chart reviewed as part of pre-operative protocol coverage. According to the RCRI, patient has a 0.4-3.9% risk of MACE. Patient reports activity equivalent to 4.0 METS (walking around home and gentle pilates). Given past medical history and time since last visit, based on ACC/AHA guidelines, Anayelli Lai would be at acceptable risk for the planned procedure without further cardiovascular testing. Patient was advised that if she develops new symptoms prior to surgery to contact our office to arrange a follow-up appointment.  she verbalized understanding. Ideally aspirin  should be continued without interruption, however if the bleeding risk is too great, aspirin  may be held for 5-7 days prior to surgery. Please resume aspirin  post operatively when it is felt to be safe from a bleeding standpoint.  Other pertinent history includes former smoker, GERD on H2 blocker, CKD 3, hypothyroid.  Preop labs reviewed, creatinine elevated 1.67 consistent with history of CKD, otherwise unremarkable.  EKG 04/04/2023: NSR.  Rate 61.  TTE 12/27/2022: 1. Left ventricular ejection fraction, by estimation, is 55 to 60%. The  left ventricle has normal function. The left ventricle has no regional  wall motion abnormalities. Left ventricular diastolic parameters are  consistent with Grade I diastolic  dysfunction (impaired relaxation). Elevated left atrial pressure.   2. Right ventricular systolic function is normal. The right ventricular  size is mildly enlarged. Tricuspid regurgitation signal is inadequate for  assessing PA pressure.   3. Left atrial size was moderately dilated.   4. The mitral valve is degenerative. Trivial mitral valve regurgitation.  Mild mitral stenosis. The mean  mitral valve gradient is 3.8 mmHg with  average heart rate of 59 bpm. Severe mitral annular calcification.   5. The aortic valve is tricuspid. There is mild calcification of the  aortic valve. There is mild thickening of the aortic valve. Aortic valve  regurgitation is not visualized. Aortic valve sclerosis/calcification is  present, without any evidence of aortic stenosis.     Lynwood Geofm RIGGERS [facility] Short Stay Center/Anesthesiology Phone [phone] 09/01/2023 2:36 PM

## 2023-09-01 NOTE — Progress Notes (Signed)
 Surgical PCR result +MRSA and +MSSA. Attempted to notify Vanessa [name], [place] scheduler for Dr. Georgina, but no answer. Voicemail left with callback number

## 2023-09-03 ENCOUNTER — Encounter: Payer: Self-pay | Admitting: Surgical

## 2023-09-03 MED ORDER — TRIAMCINOLONE ACETONIDE 40 MG/ML IJ SUSP
20.0000 mg | INTRAMUSCULAR | Status: AC | PRN
  Administered 2023-09-01: 20 mg

## 2023-09-03 MED ORDER — BUPIVACAINE HCL 0.25 % IJ SOLN
0.5000 mL | INTRAMUSCULAR | Status: AC | PRN
  Administered 2023-09-01: .5 mL

## 2023-09-03 MED ORDER — LIDOCAINE HCL 1 % IJ SOLN
3.0000 mL | INTRAMUSCULAR | Status: AC | PRN
  Administered 2023-09-01: 3 mL

## 2023-09-03 NOTE — Progress Notes (Signed)
 Office Visit Note   Patient: Vanessa [name]           Date of Birth: Feb 01, 1951           MRN: [id number] Visit Date: 09/01/2023 Requested by: Sari Bar [id number] [place],  [place] [address] PCP: Sari Bar  Subjective: Chief Complaint  Patient presents with   Right Wrist - Pain    R wrist pain, carpal tunnel/has cyst, Lumbar surg with Georgina on 8/1, discuss doing an US  guided CT injection today, to help with pain, has been on cane and not able to ambulate well, so wrist painful    HPI: Vanessa [name] is a 73 y.o. female who presents to the office reporting right wrist/hand pain.  Patient states that she has had pain ongoing since early July.  She denies any history of known injury but she has been putting more weight through her right and left arms since her back is giving her a lot of pain and she has been using a walker.  She has upcoming spine surgery with Dr. Georgina next week.  She describes hand and wrist pain primarily through the palmar aspect of the hand that extends into the thumb, index, middle fingers.  She has some symptoms in the ring finger but no symptoms in the small finger.  She has numbness and tingling and lightening bolt sensations.  Pain will wake her up at night.  She is dropping objects to the symptoms.  She has tried Ultram and Tylenol  without any relief.  Cannot take NSAIDs.  She denies any history of prior carpal tunnel release.  She does note a cyst on the volar aspect of the wrist that has been present for years and never has given her any discomfort.  She denies any significant neck symptoms or shoulder blade pain.  Sometimes her wrist and hand pain will radiate up from the wrist to the mid humeral region..                ROS: All systems reviewed are negative as they relate to the chief complaint within the history of present illness.  Patient denies fevers or chills.  Assessment & Plan: Visit Diagnoses:  1. Carpal tunnel  syndrome, right upper limb   2. Pain in right hand     Plan: Impression is 73 year old female who seems to have carpal tunnel syndrome based on her symptoms.  Likely exacerbated by her use of a walker and putting a lot of weight through the palm of her hands causing increased compression.  She does have reduced cervical spine range of motion as well and a secondary thought could be that this may be radicular pain from the cervical spine.  Seems more consistent with carpal tunnel syndrome however.  We discussed options available to patient and she would like to try carpal tunnel injection.  Think this is reasonable with her upcoming surgery and if her symptoms persist despite injection or injection only lasts for several weeks, next step would be nerve conduction study for further evaluation of nerve compression.  Under ultrasound guidance, right carpal tunnel injection administered and patient tolerated procedure well without complication.  Care was taken to avoid direct penetration into the median nerve.  Follow-Up Instructions: No follow-ups on file.   Orders:  Orders Placed This Encounter  Procedures   US  Guided Needle Placement - No Linked Charges   No orders of the defined types were placed in this encounter.  Procedures: Hand/UE Inj: R carpal tunnel for carpal tunnel syndrome on 09/01/2023 12:22 PM Indications: diagnostic, pain and therapeutic Details: 25 G needle, ultrasound-guided volar approach Medications: 3 mL lidocaine  1 %; 0.5 mL bupivacaine  0.25 %; 20 mg triamcinolone  acetonide 40 MG/ML Outcome: tolerated well, no immediate complications Procedure, treatment alternatives, risks and benefits explained, specific risks discussed. Consent was given by the patient. Immediately prior to procedure a time out was called to verify the correct patient, procedure, equipment, support staff and site/side marked as required. Patient was prepped and draped in the usual sterile fashion.        Clinical Data: No additional findings.  Objective: Vital Signs: There were no vitals taken for this visit.  Physical Exam:  Constitutional: Patient appears well-developed HEENT:  Head: Normocephalic Eyes:EOM are normal Neck: Normal range of motion Cardiovascular: Normal rate Pulmonary/chest: Effort normal Neurologic: Patient is alert Skin: Skin is warm Psychiatric: Patient has normal mood and affect  Ortho Exam: Ortho exam demonstrates right hand with 2+ radial pulse.  Intact EPL, FPL, finger abduction, pronation/supination.  She has grip strength rated 5/5 of the left hand and 5 -/5 of the right hand.  There is no thenar or hypothenar atrophy noted or any wasting in the webspaces of the right hand.  She has positive Durkan sign and positive Tinel sign reproducing symptoms in the median nerve distribution.  No cellulitis or skin changes noted.  She has no tenderness over the 3-4 portal, 4-5 portal, ulnar fovea, scaphoid tubercle, anatomic snuffbox.  Able to make full composite fist.  Negative Lhermitte sign bilaterally.  Negative Spurling sign.  She does have somewhat limited cervical spine range of motion primarily when she performs a Lhermitte sign.  Specialty Comments:  MRI LUMBAR SPINE WITHOUT CONTRAST   TECHNIQUE: Multiplanar, multisequence MR imaging of the lumbar spine was performed. No intravenous contrast was administered.   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 The last full/open intervertebral disc space is labeled L5-S1 and there is a small disc space at S1-2.   Alignment:  Degenerative anterolisthesis of L5.   Vertebrae:  Normal marrow signal.  No bone lesions or fractures.   Conus medullaris and cauda equina: Conus extends to the L1 level. Conus and cauda equina appear normal.   Paraspinal and other soft tissues: No significant paraspinal  or retroperitoneal findings.   Disc levels:   T12-L1: No significant findings. Small right-sided perineural cyst or dilated nerve root sheath.   L1-2: Mild facet disease and ligamentum flavum thickening but no significant spinal or foraminal stenosis.   L2-3: Mild annular bulge and moderate facet disease contributing to mild bilateral lateral recess encroachment. No spinal or foraminal stenosis.   L3-4: Bulging annulus, facet disease and ligamentum flavum thickening contributing to early spinal stenosis and mild bilateral lateral recess stenosis. No foraminal stenosis.   L4-5: Bulging uncovered disc, short pedicles, advanced facet disease and ligamentum flavum thickening all contributing to severe spinal and bilateral lateral recess stenosis. No significant foraminal stenosis.   L5-S1: Degenerative anterolisthesis of L5 due to severe facet disease. There is a bulging uncovered disc in conjunction with advanced facet disease and ligamentum flavum thickening contributing to severe spinal and bilateral lateral recess stenosis. No foraminal stenosis.   IMPRESSION: 1. 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 The last full/open intervertebral disc space is labeled L5-S1 and there is a small disc space at S1-2. 2. Mild bilateral lateral recess  encroachment at L2-3. 3. Early spinal stenosis and mild bilateral lateral recess stenosis at L3-4. 4. Severe multifactorial spinal and bilateral lateral recess stenosis at L4-5. 5. Severe multifactorial spinal and bilateral lateral recess stenosis at L5-S1.     Electronically Signed   By: MYRTIS Stammer M.D.   On: 05/20/2023 14:47  Imaging: No results found.   PMFS History: Patient Active Problem List   Diagnosis Date Noted   Degenerative spondylolisthesis 04/25/2023   Neural foraminal stenosis of lumbar spine 04/25/2023   Complete tear of right rotator cuff    Biceps tendonitis on right    Degenerative  superior labral anterior-to-posterior (SLAP) tear of right shoulder    Unilateral primary osteoarthritis, right knee 05/13/2020   Past Medical History:  Diagnosis Date   Aortic valve sclerosis    Arthritis    osteoarthritis   Back pain    Cancer (HCC)    endometrial   Chronic kidney disease    stage 3   Depression    GERD (gastroesophageal reflux disease)    Heart murmur    AV sclerosis; 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  Past Surgical History:  Procedure Laterality Date   ABDOMINAL HYSTERECTOMY  2008   BREAST SURGERY Right 06/20/2011   lumpectomy for atypical ductal hyperplasia   SHOULDER ARTHROSCOPY WITH ROTATOR CUFF REPAIR AND SUBACROMIAL DECOMPRESSION Right 01/29/2021   Procedure: RIGHT SHOULDER ARTHROSCOPY, DEBRIDEMENT, WITH MINI OPEN ROTATOR CUFF TEAR REPAIR & BICEPS TENODESIS;  Surgeon: Addie Cordella Hamilton, MD;  Location: [facility] OR;  Service: Orthopedics;  Laterality: Right;   TONSILLECTOMY     TUBAL LIGATION     Social History   Occupational History   Not on file  Tobacco Use   Smoking status: Former    Types: Cigarettes   Smokeless tobacco: Never  Vaping Use   Vaping status: Never Used  Substance and Sexual Activity   Alcohol use: Yes    Comment: 1-2 drinks/wk when not taking Tramadol   Drug use: Never   Sexual activity: Not on file

## 2023-09-07 NOTE — H&P (Signed)
 Orthopedic Spine Surgery H&P Note  Assessment: Patient is a 73 y.o. female with lumbar stenosis and neurogenic claudication   Plan: -Out of bed as tolerated, activity as tolerated, no brace -Covered the risks of surgery one more time with the patient and patient elected to proceed with planned surgery -Written consent verified -Hold anticoagulation in anticipation of surgery -Ancef  and TXA on call to OR -NPO for procedure -Site marked -To OR when ready  The risks covered this morning included but were not limited to:  iatrogenic instability, dural tear, nerve root injury, paralysis, persistent pain, infection, bleeding, heart attack, death, stroke, fracture, dvt/pe, and need for additional procedures.   ___________________________________________________________________________  Chief Complaint: low back pain that radiates into bilateral lower extremities  History: Patient is 73 y.o. female who has been previously seen in the office for low back pain that radiates into bilateral buttocks and posterior thighs. Her work up was consistent with neurogenic claudication from lumbar stenosis. Her symptoms failed to improve with conservative treatment so operative management was discussed at the last office visit. The patient presents today with no changes in her symptoms since the last office visit. See previous office note for further details.    Review of systems: General: denies fevers and chills, myalgias Neurologic: denies recent changes in vision, slurred speech Abdomen: denies nausea, vomiting, hematemesis Respiratory: denies cough, shortness of breath  Past medical history: Anxiety History of endometrial cancer HLD HTN CAD CKD GERD   Allergies: lisinopril, codeine, morphine   Past surgical history:  Right ACL reconstruction Right shoulder rotator cuff repair Hysterectomy Tonsillectomy   Social history: Denies use of nicotine product (smoking, vaping, patches,  smokeless) Alcohol use: Yes, approximately 3-4 drinks per week Denies recreational drug use  Family history: -reviewed and not pertinent to lumbar stenosis with neurogenic claudication   Physical Exam:  General: no acute distress, appears stated age Neurologic: alert, answering questions appropriately, following commands Cardiovascular: regular rate, no cyanosis Respiratory: unlabored breathing on room air, symmetric chest rise Psychiatric: appropriate affect, normal cadence to speech   MSK (spine):  -Strength exam      Left  Right  EHL    5/5  5/5 TA    5/5  5/5 GSC    5/5  5/5 Knee extension  5/5  5/5 Knee flexion   5/5  5/5 Hip flexion   5/5  5/5  -Sensory exam    Sensation intact to light touch in L3-S1 nerve distributions of bilateral lower extremities   Patient name: Vanessa [name] Patient MRN: [id number] Date: 09/08/2023

## 2023-09-08 ENCOUNTER — Observation Stay (HOSPITAL_COMMUNITY)
Admission: RE | Admit: 2023-09-08 | Discharge: 2023-09-09 | Disposition: A | Discharge status: Home / Self Care | Attending: Orthopedic Surgery | Admitting: Orthopedic Surgery

## 2023-09-08 ENCOUNTER — Ambulatory Visit (HOSPITAL_BASED_OUTPATIENT_CLINIC_OR_DEPARTMENT_OTHER): Payer: Self-pay | Admitting: Anesthesiology

## 2023-09-08 ENCOUNTER — Other Ambulatory Visit (HOSPITAL_COMMUNITY): Payer: Self-pay

## 2023-09-08 ENCOUNTER — Ambulatory Visit (HOSPITAL_COMMUNITY): Payer: Self-pay | Admitting: Physician Assistant

## 2023-09-08 ENCOUNTER — Other Ambulatory Visit: Payer: Self-pay

## 2023-09-08 ENCOUNTER — Ambulatory Visit (HOSPITAL_COMMUNITY)

## 2023-09-08 ENCOUNTER — Encounter (HOSPITAL_COMMUNITY)
Admission: RE | Disposition: A | Discharge status: Home / Self Care | Payer: Self-pay | Source: Home / Self Care | Attending: Orthopedic Surgery

## 2023-09-08 ENCOUNTER — Encounter (HOSPITAL_COMMUNITY): Payer: Self-pay | Admitting: Orthopedic Surgery

## 2023-09-08 DIAGNOSIS — I129 Hypertensive chronic kidney disease with stage 1 through stage 4 chronic kidney disease, or unspecified chronic kidney disease: Secondary | ICD-10-CM

## 2023-09-08 DIAGNOSIS — M48062 Spinal stenosis, lumbar region with neurogenic claudication: Principal | ICD-10-CM | POA: Diagnosis present

## 2023-09-08 DIAGNOSIS — N183 Chronic kidney disease, stage 3 unspecified: Secondary | ICD-10-CM

## 2023-09-08 DIAGNOSIS — M47816 Spondylosis without myelopathy or radiculopathy, lumbar region: Secondary | ICD-10-CM

## 2023-09-08 DIAGNOSIS — F1090 Alcohol use, unspecified, uncomplicated: Secondary | ICD-10-CM | POA: Insufficient documentation

## 2023-09-08 DIAGNOSIS — N189 Chronic kidney disease, unspecified: Secondary | ICD-10-CM | POA: Diagnosis not present

## 2023-09-08 DIAGNOSIS — I2510 Atherosclerotic heart disease of native coronary artery without angina pectoris: Secondary | ICD-10-CM | POA: Diagnosis not present

## 2023-09-08 DIAGNOSIS — Z01818 Encounter for other preprocedural examination: Principal | ICD-10-CM

## 2023-09-08 HISTORY — PX: DECOMPRESSIVE LUMBAR LAMINECTOMY LEVEL 1: SHX5791

## 2023-09-08 SURGERY — DECOMPRESSIVE LUMBAR LAMINECTOMY LEVEL 1
Anesthesia: General | Site: Spine Lumbar

## 2023-09-08 MED ORDER — BUPIVACAINE-EPINEPHRINE (PF) 0.25% -1:200000 IJ SOLN
INTRAMUSCULAR | Status: AC
  Filled 2023-09-08: qty 30

## 2023-09-08 MED ORDER — TRANEXAMIC ACID-NACL 1000-0.7 MG/100ML-% IV SOLN
1000.0000 mg | Freq: Once | INTRAVENOUS | Status: AC
  Administered 2023-09-08: 1000 mg via INTRAVENOUS
  Filled 2023-09-08: qty 100

## 2023-09-08 MED ORDER — METOPROLOL SUCCINATE ER 25 MG PO TB24
25.0000 mg | ORAL_TABLET | Freq: Every day | ORAL | Status: DC
  Filled 2023-09-08: qty 1

## 2023-09-08 MED ORDER — PROPOFOL 10 MG/ML IV BOLUS
INTRAVENOUS | Status: DC | PRN
  Administered 2023-09-08: 130 mg via INTRAVENOUS

## 2023-09-08 MED ORDER — SUGAMMADEX SODIUM 200 MG/2ML IV SOLN
INTRAVENOUS | Status: DC | PRN
  Administered 2023-09-08: 50 mg via INTRAVENOUS
  Administered 2023-09-08: 150 mg via INTRAVENOUS

## 2023-09-08 MED ORDER — ORAL CARE MOUTH RINSE: 15.0000 mL | Freq: Once | OROMUCOSAL | Status: AC

## 2023-09-08 MED ORDER — EPHEDRINE SULFATE-NACL 50-0.9 MG/10ML-% IV SOSY
PREFILLED_SYRINGE | INTRAVENOUS | Status: DC | PRN
  Administered 2023-09-08: 5 mg via INTRAVENOUS
  Administered 2023-09-08 (×2): 10 mg via INTRAVENOUS

## 2023-09-08 MED ORDER — EZETIMIBE 10 MG PO TABS
10.0000 mg | ORAL_TABLET | Freq: Every day | ORAL | Status: DC
  Filled 2023-09-08: qty 1

## 2023-09-08 MED ORDER — HAIR SKIN AND NAILS FORMULA PO TABS: 1.0000 | ORAL_TABLET | ORAL | Status: DC

## 2023-09-08 MED ORDER — POLYETHYLENE GLYCOL 3350 17 G PO PACK
17.0000 g | PACK | Freq: Every day | ORAL | Status: DC
  Filled 2023-09-08: qty 1

## 2023-09-08 MED ORDER — VANCOMYCIN HCL IN DEXTROSE 1-5 GM/200ML-% IV SOLN
1000.0000 mg | INTRAVENOUS | Status: AC
  Administered 2023-09-08: 1000 mg via INTRAVENOUS
  Filled 2023-09-08: qty 200

## 2023-09-08 MED ORDER — CHLORHEXIDINE GLUCONATE 0.12 % MT SOLN
15.0000 mL | Freq: Once | OROMUCOSAL | Status: AC
  Administered 2023-09-08: 15 mL via OROMUCOSAL
  Filled 2023-09-08: qty 15

## 2023-09-08 MED ORDER — PROPOFOL 10 MG/ML IV BOLUS
INTRAVENOUS | Status: AC
  Filled 2023-09-08: qty 20

## 2023-09-08 MED ORDER — VANCOMYCIN HCL 1000 MG IV SOLR
INTRAVENOUS | Status: DC | PRN
  Administered 2023-09-08: 1000 mg via TOPICAL

## 2023-09-08 MED ORDER — VENLAFAXINE HCL ER 75 MG PO CP24
75.0000 mg | ORAL_CAPSULE | Freq: Every day | ORAL | Status: DC
  Administered 2023-09-09: 75 mg via ORAL
  Filled 2023-09-08: qty 1

## 2023-09-08 MED ORDER — DEXAMETHASONE SODIUM PHOSPHATE 10 MG/ML IJ SOLN
10.0000 mg | Freq: Once | INTRAMUSCULAR | Status: DC
  Filled 2023-09-08: qty 1

## 2023-09-08 MED ORDER — LACTATED RINGERS IV SOLN: INTRAVENOUS | Status: DC

## 2023-09-08 MED ORDER — SENNA 8.6 MG PO TABS
1.0000 | ORAL_TABLET | Freq: Two times a day (BID) | ORAL | 0 refills | Status: AC
  Filled 2023-09-08: qty 28, 14d supply, fill #0

## 2023-09-08 MED ORDER — SODIUM BICARBONATE 650 MG PO TABS
650.0000 mg | ORAL_TABLET | Freq: Two times a day (BID) | ORAL | Status: DC
  Administered 2023-09-08 – 2023-09-09 (×3): 650 mg via ORAL
  Filled 2023-09-08 (×3): qty 1

## 2023-09-08 MED ORDER — OXYCODONE HCL 5 MG PO TABS: 5.0000 mg | ORAL_TABLET | Freq: Once | ORAL | Status: DC | PRN

## 2023-09-08 MED ORDER — FUROSEMIDE 20 MG PO TABS
20.0000 mg | ORAL_TABLET | Freq: Every day | ORAL | Status: DC
  Administered 2023-09-08 – 2023-09-09 (×2): 20 mg via ORAL
  Filled 2023-09-08 (×2): qty 1

## 2023-09-08 MED ORDER — FENTANYL CITRATE (PF) 100 MCG/2ML IJ SOLN
INTRAMUSCULAR | Status: AC
Start: 2023-09-08 — End: 2023-09-08
  Filled 2023-09-08: qty 2

## 2023-09-08 MED ORDER — LACTATED RINGERS IV SOLN: INTRAVENOUS | Status: DC | PRN

## 2023-09-08 MED ORDER — ONDANSETRON HCL 4 MG/2ML IJ SOLN
INTRAMUSCULAR | Status: DC | PRN
  Administered 2023-09-08: 4 mg via INTRAVENOUS

## 2023-09-08 MED ORDER — FAMOTIDINE 20 MG PO TABS
20.0000 mg | ORAL_TABLET | Freq: Two times a day (BID) | ORAL | Status: DC
  Administered 2023-09-08 (×2): 20 mg via ORAL
  Filled 2023-09-08 (×3): qty 1

## 2023-09-08 MED ORDER — ONDANSETRON HCL 4 MG/2ML IJ SOLN: 4.0000 mg | Freq: Four times a day (QID) | INTRAMUSCULAR | Status: DC | PRN

## 2023-09-08 MED ORDER — ONDANSETRON HCL 4 MG/2ML IJ SOLN: 4.0000 mg | Freq: Once | INTRAMUSCULAR | Status: DC | PRN

## 2023-09-08 MED ORDER — POLYETHYLENE GLYCOL 3350 17 GM/SCOOP PO POWD
17.0000 g | Freq: Every day | ORAL | 0 refills | Status: AC
  Filled 2023-09-08: qty 238, 14d supply, fill #0

## 2023-09-08 MED ORDER — OXYCODONE HCL 5 MG PO TABS
ORAL_TABLET | ORAL | Status: AC
  Filled 2023-09-08: qty 2

## 2023-09-08 MED ORDER — MEPERIDINE HCL 25 MG/ML IJ SOLN: 6.2500 mg | INTRAMUSCULAR | Status: DC | PRN

## 2023-09-08 MED ORDER — METHOCARBAMOL 500 MG PO TABS
ORAL_TABLET | ORAL | Status: AC
  Filled 2023-09-08: qty 1

## 2023-09-08 MED ORDER — NIFEDIPINE ER OSMOTIC RELEASE 60 MG PO TB24
60.0000 mg | ORAL_TABLET | Freq: Two times a day (BID) | ORAL | Status: DC
  Administered 2023-09-08 – 2023-09-09 (×2): 60 mg via ORAL
  Filled 2023-09-08 (×2): qty 1

## 2023-09-08 MED ORDER — ACETAMINOPHEN 10 MG/ML IV SOLN
INTRAVENOUS | Status: DC | PRN
Start: 2023-09-08 — End: 2023-09-08
  Administered 2023-09-08: 1000 mg via INTRAVENOUS

## 2023-09-08 MED ORDER — VANCOMYCIN HCL 1000 MG IV SOLR
INTRAVENOUS | Status: AC
  Filled 2023-09-08: qty 20

## 2023-09-08 MED ORDER — OXYCODONE HCL 5 MG PO TABS
5.0000 mg | ORAL_TABLET | ORAL | 0 refills | Status: AC | PRN
  Filled 2023-09-08: qty 40, 7d supply, fill #0

## 2023-09-08 MED ORDER — OXYCODONE HCL 5 MG/5ML PO SOLN: 5.0000 mg | Freq: Once | ORAL | Status: DC | PRN

## 2023-09-08 MED ORDER — ACETAMINOPHEN 500 MG PO TABS
1000.0000 mg | ORAL_TABLET | Freq: Three times a day (TID) | ORAL | 0 refills | Status: AC
  Filled 2023-09-08: qty 84, 14d supply, fill #0

## 2023-09-08 MED ORDER — METHOCARBAMOL 500 MG PO TABS
500.0000 mg | ORAL_TABLET | Freq: Four times a day (QID) | ORAL | 0 refills | Status: AC
  Filled 2023-09-08: qty 40, 10d supply, fill #0

## 2023-09-08 MED ORDER — FENTANYL CITRATE (PF) 100 MCG/2ML IJ SOLN
25.0000 ug | INTRAMUSCULAR | Status: DC | PRN
  Administered 2023-09-08: 25 ug via INTRAVENOUS
  Administered 2023-09-08: 50 ug via INTRAVENOUS

## 2023-09-08 MED ORDER — ROCURONIUM BROMIDE 10 MG/ML (PF) SYRINGE
PREFILLED_SYRINGE | INTRAVENOUS | Status: DC | PRN
  Administered 2023-09-08: 20 mg via INTRAVENOUS
  Administered 2023-09-08: 50 mg via INTRAVENOUS
  Administered 2023-09-08: 20 mg via INTRAVENOUS
  Administered 2023-09-08: 30 mg via INTRAVENOUS

## 2023-09-08 MED ORDER — FENTANYL CITRATE (PF) 250 MCG/5ML IJ SOLN
INTRAMUSCULAR | Status: AC
  Filled 2023-09-08: qty 5

## 2023-09-08 MED ORDER — POVIDONE-IODINE 10 % EX SWAB
2.0000 | Freq: Once | CUTANEOUS | Status: AC
Start: 2023-09-08 — End: 2023-09-08
  Administered 2023-09-08: 2 via TOPICAL

## 2023-09-08 MED ORDER — LORATADINE 10 MG PO TABS: 10.0000 mg | ORAL_TABLET | Freq: Every day | ORAL | Status: DC | PRN

## 2023-09-08 MED ORDER — ONDANSETRON HCL 4 MG PO TABS: 4.0000 mg | ORAL_TABLET | Freq: Four times a day (QID) | ORAL | Status: DC | PRN

## 2023-09-08 MED ORDER — STERILE WATER FOR IRRIGATION IR SOLN
Status: DC | PRN
  Administered 2023-09-08: 1000 mL

## 2023-09-08 MED ORDER — ACETAMINOPHEN 500 MG PO TABS
1000.0000 mg | ORAL_TABLET | Freq: Three times a day (TID) | ORAL | Status: DC
  Administered 2023-09-08 – 2023-09-09 (×3): 1000 mg via ORAL
  Filled 2023-09-08 (×3): qty 2

## 2023-09-08 MED ORDER — MIDAZOLAM HCL 2 MG/2ML IJ SOLN
INTRAMUSCULAR | Status: AC
  Filled 2023-09-08: qty 2

## 2023-09-08 MED ORDER — LEVOTHYROXINE SODIUM 75 MCG PO TABS
150.0000 ug | ORAL_TABLET | Freq: Every day | ORAL | Status: DC
Start: 2023-09-09 — End: 2023-09-09
  Administered 2023-09-09: 150 ug via ORAL
  Filled 2023-09-08: qty 2

## 2023-09-08 MED ORDER — HYDRALAZINE HCL 50 MG PO TABS
100.0000 mg | ORAL_TABLET | Freq: Three times a day (TID) | ORAL | Status: DC
  Administered 2023-09-08 – 2023-09-09 (×3): 100 mg via ORAL
  Filled 2023-09-08 (×3): qty 2

## 2023-09-08 MED ORDER — DEXAMETHASONE SODIUM PHOSPHATE 10 MG/ML IJ SOLN
INTRAMUSCULAR | Status: DC | PRN
  Administered 2023-09-08: 10 mg via INTRAVENOUS

## 2023-09-08 MED ORDER — CEFAZOLIN SODIUM-DEXTROSE 2-4 GM/100ML-% IV SOLN
2.0000 g | Freq: Four times a day (QID) | INTRAVENOUS | Status: AC
  Administered 2023-09-08 (×2): 2 g via INTRAVENOUS
  Filled 2023-09-08 (×2): qty 100

## 2023-09-08 MED ORDER — PROPOFOL 500 MG/50ML IV EMUL
INTRAVENOUS | Status: DC | PRN
Start: 2023-09-08 — End: 2023-09-08
  Administered 2023-09-08: 150 ug/kg/min via INTRAVENOUS

## 2023-09-08 MED ORDER — 0.9 % SODIUM CHLORIDE (POUR BTL) OPTIME
TOPICAL | Status: DC | PRN
  Administered 2023-09-08: 1000 mL

## 2023-09-08 MED ORDER — MIDAZOLAM HCL 2 MG/2ML IJ SOLN
INTRAMUSCULAR | Status: DC | PRN
  Administered 2023-09-08: 2 mg via INTRAVENOUS

## 2023-09-08 MED ORDER — SENNA 8.6 MG PO TABS
1.0000 | ORAL_TABLET | Freq: Two times a day (BID) | ORAL | Status: DC
  Administered 2023-09-08 (×2): 8.6 mg via ORAL
  Filled 2023-09-08 (×3): qty 1

## 2023-09-08 MED ORDER — TRANEXAMIC ACID-NACL 1000-0.7 MG/100ML-% IV SOLN
1000.0000 mg | INTRAVENOUS | Status: AC
  Administered 2023-09-08: 1000 mg via INTRAVENOUS
  Filled 2023-09-08: qty 100

## 2023-09-08 MED ORDER — BUPIVACAINE-EPINEPHRINE 0.25% -1:200000 IJ SOLN
INTRAMUSCULAR | Status: DC | PRN
Start: 2023-09-08 — End: 2023-09-08
  Administered 2023-09-08: 30 mL

## 2023-09-08 MED ORDER — CEFAZOLIN SODIUM-DEXTROSE 2-4 GM/100ML-% IV SOLN
2.0000 g | INTRAVENOUS | Status: AC
  Administered 2023-09-08: 2 g via INTRAVENOUS
  Filled 2023-09-08: qty 100

## 2023-09-08 MED ORDER — OXYCODONE HCL 5 MG PO TABS
5.0000 mg | ORAL_TABLET | ORAL | Status: DC | PRN
  Administered 2023-09-08 (×3): 10 mg via ORAL
  Administered 2023-09-09: 5 mg via ORAL
  Filled 2023-09-08: qty 2
  Filled 2023-09-08: qty 1
  Filled 2023-09-08: qty 2

## 2023-09-08 MED ORDER — METHOCARBAMOL 500 MG PO TABS
500.0000 mg | ORAL_TABLET | Freq: Four times a day (QID) | ORAL | Status: DC
  Administered 2023-09-08 – 2023-09-09 (×5): 500 mg via ORAL
  Filled 2023-09-08 (×4): qty 1

## 2023-09-08 MED ORDER — ATORVASTATIN CALCIUM 80 MG PO TABS
80.0000 mg | ORAL_TABLET | Freq: Every day | ORAL | Status: DC
  Filled 2023-09-08: qty 1

## 2023-09-08 MED ORDER — FENTANYL CITRATE (PF) 250 MCG/5ML IJ SOLN
INTRAMUSCULAR | Status: DC | PRN
  Administered 2023-09-08 (×3): 50 ug via INTRAVENOUS
  Administered 2023-09-08: 100 ug via INTRAVENOUS

## 2023-09-08 MED ORDER — LIDOCAINE 2% (20 MG/ML) 5 ML SYRINGE
INTRAMUSCULAR | Status: DC | PRN
  Administered 2023-09-08: 60 mg via INTRAVENOUS

## 2023-09-08 MED ORDER — HYDROMORPHONE HCL 1 MG/ML IJ SOLN: 0.5000 mg | INTRAMUSCULAR | Status: AC | PRN

## 2023-09-08 SURGICAL SUPPLY — 34 items
BUR MATCHSTICK NEURO 3.0 LAGG (BURR) ×1 IMPLANT
CANISTER SUCTION 3000ML PPV (SUCTIONS) ×1 IMPLANT
COVER MAYO STAND STRL (DRAPES) ×3 IMPLANT
COVER SURGICAL LIGHT HANDLE (MISCELLANEOUS) ×1 IMPLANT
DERMABOND ADVANCED .7 DNX12 (GAUZE/BANDAGES/DRESSINGS) ×1 IMPLANT
DRAPE C-ARM 42X72 X-RAY (DRAPES) ×1 IMPLANT
DRAPE UTILITY XL STRL (DRAPES) ×2 IMPLANT
DRESSING MEPILEX FLEX 4X4 (GAUZE/BANDAGES/DRESSINGS) ×1 IMPLANT
DRSG MEPILEX POST OP 4X8 (GAUZE/BANDAGES/DRESSINGS) ×1 IMPLANT
DRSG TEGADERM 4X4.75 (GAUZE/BANDAGES/DRESSINGS) ×2 IMPLANT
DURAPREP 26ML APPLICATOR (WOUND CARE) ×1 IMPLANT
ELECT COATED BLADE 2.86 ST (ELECTRODE) ×1 IMPLANT
ELECT PENCIL ROCKER SW 15FT (MISCELLANEOUS) ×1 IMPLANT
ELECTRODE REM PT RTRN 9FT ADLT (ELECTROSURGICAL) ×1 IMPLANT
GAUZE SPONGE 4X4 12PLY STRL (GAUZE/BANDAGES/DRESSINGS) ×1 IMPLANT
GLOVE INDICATOR 7.5 STRL GRN (GLOVE) ×1 IMPLANT
GLOVE SS BIOGEL STRL SZ 7.5 (GLOVE) ×1 IMPLANT
GOWN STRL REUS W/ TWL LRG LVL3 (GOWN DISPOSABLE) ×1 IMPLANT
GOWN STRL SURGICAL XL XLNG (GOWN DISPOSABLE) ×1 IMPLANT
KIT BASIN OR (CUSTOM PROCEDURE TRAY) ×1 IMPLANT
KIT POSITIONER JACKSON TABLE (MISCELLANEOUS) ×1 IMPLANT
KIT TURNOVER KIT B (KITS) ×1 IMPLANT
NS IRRIG 1000ML POUR BTL (IV SOLUTION) ×1 IMPLANT
PACK LAMINECTOMY ORTHO (CUSTOM PROCEDURE TRAY) ×1 IMPLANT
SUCTION TUBE FRAZIER 10FR DISP (SUCTIONS) ×1 IMPLANT
SUT BONE WAX W31G (SUTURE) ×1 IMPLANT
SUT MNCRL AB 3-0 PS2 27 (SUTURE) ×1 IMPLANT
SUT VIC AB 0 CT1 18XCR BRD8 (SUTURE) ×1 IMPLANT
SUT VIC AB 2-0 CT1 18 (SUTURE) ×1 IMPLANT
SYR CONTROL 10ML LL (SYRINGE) IMPLANT
TOWEL GREEN STERILE (TOWEL DISPOSABLE) ×1 IMPLANT
TOWEL GREEN STERILE FF (TOWEL DISPOSABLE) ×1 IMPLANT
TUBING FEATHERFLOW (TUBING) ×1 IMPLANT
WATER STERILE IRR 1000ML POUR (IV SOLUTION) ×1 IMPLANT

## 2023-09-08 NOTE — Discharge Instructions (Signed)
 Orthopedic Surgery Discharge Instructions  Patient name: Tanny Harnack Procedure Performed: L4/5 laminectomy Date of Surgery: 09/08/2023 Surgeon: Ozell Ada, MD  Pre-operative Diagnosis: lumbar stenosis with neurogenic claudication Post-operative Diagnosis: same as above  Discharge Date: 09/09/2023 Discharged to: home Discharge Condition: stable  Activity: You should refrain from bending, lifting, or twisting with objects greater than ten pounds until six weeks after surgery. You are encouraged to walk as much as desired. You can perform household activities such as cleaning dishes, doing laundry, vacuuming, etc. as long as the ten-pound restriction is followed. You do not need to wear a brace during the post-operative period.   Incision Care: Your incision site has a dressing over it. That dressing should remain in place and dry at all times for a total of one week after surgery. After one week, you can remove the dressing. Underneath the dressing, you will find skin glue. You should leave the skin glue in place. It will fall off with time. Do not pick, rub, or scrub at it. Do not put cream or lotion over the surgical area. After one week and once the dressing is off, it is okay to let soap and water run over your incision. Again, do not pick, scrub, or rub at the pieces of tape when bathing. Do not submerge (e.g., take a bath, swim, go in a hot tub, etc.) until six weeks after surgery. There may be some bloody drainage from the incision into the dressing after surgery. This is normal. You do not need to replace the dressing. Continue to leave it in place for the one week as instructed above. Should the dressing become saturated with blood or drainage, please call the office for further instructions.   Medications: You have been prescribed oxycodone . This is a narcotic pain medication and should only be taken as prescribed. You should not drink alcohol or operate heavy machinery  (including driving) while taking this medication. The oxycodone  can cause constipation as a side effect. For that reason, you have been prescribed senna and miralax . These are both laxatives. You do not need to take this medication if you develop diarrhea. Should you remain constipated even while taking these medications, please increase the dose of miralax  to twice daily. Tylenol  has been prescribed to be taken every 8 hours, which will give you additional pain relief. Robaxin  is a muscle relaxer that has been prescribed to you for muscle spasm type pain. Take this medication as needed.   In order to set expectations for opioid prescriptions, you will only be prescribed opioids for a total of six weeks after surgery and, at two-weeks after surgery, your opioid prescription will start to tapered (decreased dosage and number of pills). If you have ongoing need for opioid medication six weeks after surgery, you will be referred to pain management. If you are already established with a provider that is giving you opioid medications, you should schedule an appointment with them for six weeks after surgery if you feel you are going to need another prescription. State law only allows for opioid prescriptions one week at a time. If you are running out of opioid medication near the end of the week, please call the office during business hours before running out so I can send you another prescription.   You may resume any home blood thinners (warfarin, lovenox, apixaban, plavix, xarelto, etc) 72 hours after your surgery. Take these medications as they were previously prescribed.  Driving: You should not drive while taking narcotic  pain medications. You should start getting back to driving slowly and you may want to try driving in a parking lot before doing anything more.   Diet: You are safe to resume your regular diet after surgery.   Reasons to Call the Office After Surgery: You should feel free to call the  office with any concerns or questions you have in the post-operative period, but you should definitely notify the office if you develop: -shortness of breath, chest pain, or trouble breathing -excessive bleeding, drainage, redness, or swelling around the surgical site -fevers, chills, or pain that is getting worse with each passing day -persistent nausea or vomiting -new weakness in either leg -new or worsening numbness or tingling in either leg -numbness in the groin, bowel or bladder incontinence -other concerns about your surgery  Follow Up Appointments: You should have an office appointment scheduled for approximately two weeks after surgery. If you do not remember when this appointment is or do not already have it scheduled, please call the office to schedule.   Office Information:  -Ozell Ada, MD -Phone number: [phone] -Address: [address]       [place], [place] [address]

## 2023-09-08 NOTE — Op Note (Signed)
 Orthopedic Spine Surgery Operative Report  Procedure: L4, L5 segment lumbar laminectomies with partial medial facetectomies  Modifier: none  Date of procedure: 09/08/2023  Patient name: Vanessa [name] MRN: [id number] DOB: 1950-09-02  Surgeon: Ozell Ada, MD Assistant: none Pre-operative diagnosis: lumbar stenosis with neurogenic claudication Post-operative diagnosis: same as above Findings: L4/5 hypertrophic facets and thickened ligamentum flavum  Specimens: none Anesthesia: general EBL: 50cc Complications: none Pre-incision antibiotic: ancef   TXA was given prior to incision as well  Implants: none   Indication for procedure: Patient is a 73 y.o. female who presented to the office with symptoms consistent with neurogenic claudication from lumbar stenosis. The patient had tried conservative treatments that did not provide any lasting relief. As result, operative management was discussed. A L4 and L5 segment laminectomy with partial medial facetectomies was presented as a treatment option. The risks including but not limited to iatrogenic instability, dural tear, nerve root injury, paralysis, persistent pain, infection, bleeding, heart attack, death, dvt/pe, fracture, and need for additional procedures were discussed with the patient. The benefit of the surgery would be improvement in the patient's radiating leg pain. The alternatives to surgical management were covered with the patient and included continued monitoring, physical therapy, over-the-counter pain medications, ambulatory aids, repeat injections, and activity modification. All the patient's questions were answered to her satisfaction. After this discussion, the patient expressed understanding and elected to proceed with surgical intervention.   Procedure Description: The patient was met in the pre-operative holding area. The patient's identity and consent were verified. The operative site was marked. The patient's  remaining questions about the surgery were answered. The patient was brought back to the operating room. General anesthesia was induced and an endotracheal tube was placed by the anesthesia staff. The patient was transferred to the prone [place] table in the prone position. All bony prominences were well padded. The head of the bed was slightly elevated and the eyes were free from compression by the face pillow. The surgical area was cleansed with alcohol. Fluoroscopy was then brought in to check rotation on the AP image and to mark the levels on the lateral image. The patient's skin was then prepped and draped in a standard, sterile fashion. A time out was performed that identified the patient, the procedure, and the operative levels. All team members agreed with what was stated in the time out.   A midline incision over the spinous processes of the previously marked levels was made and sharp dissection was continued down through the skin and dermis. Electrocautery was then used to continue the midline dissection down to the level of the spinous process. Subperiosteal dissection was performed using electrocautery to expose the lamina out lateral to the facet joint capsule. Care was taken to not violate the facet joint capsules. A lateral fluoroscopic image was taken to confirm the level. Subperiosteal dissection with electrocautery was then done to expose all the lamina and pars interarticularis at L4 and L5. Again, care was taken to avoid disruption of the facet capsules.    A rongeur was used to remove the spinous processes and interspinous ligaments between L3/4 interspinous ligament to the cranial portion of the L5 spinous process. Bone wax was used to obtain hemostasis at the bleeding bony surfaces. A high-speed burr was used to thin the lamina at all L4 and L5 to the level of the ligamentum flavum. Above the level of the ligamentum, the lamina was thinned with the burr to the approximate level of the  ligamentum. Care  was taken to leave at least 8mm of pars interarticularis on each side. A series of Kerrison rongeurs were used to remove the remaining lamina and ligamentum overlying the thecal sac. A woodsen was then used to protect the thecal sac as a kerrison was used to remove the medial portion of the L4/5 facet. The same process was then repeated on the contralateral side to remove the medial portion of that L4/5 facet.   A woodsen was placed into the laminectomy site to palpate for any remaining areas of stenosis. The woodsen was able to be passed freely around the thecal sac with no further stenosis felt. A lateral fluoroscopic film was with the woodsen cranially to demonstrate the extent of the decompression. The woodsen was then placed into the caudal aspect of the laminectomy and a lateral image was taken to demonstrate the extent of the decompression in that direction.   The wound was copiously irrigated with sterile saline. 1g of vancomycin powder was placed into the wound. The fascia was reapproximated with 0 vicryl suture. The subcutaneous fat was reapproximated with 0 vicryl suture. The deep dermal layer was reapproximated with 2-0 vicryl. The skin as closed with a 3-0 running monocryl. All counts were correct at the end of the case. Dermabond was applied over the skin. An island dressing was placed over the wound. The patient was transferred back to a bed and brought to the post-anesthesia care unit by anesthesia staff in stable condition.  Post-operative plan: The patient will recover in the post-anesthesia care unit and then go to the floor. The patient will receive two post-operative doses of ancef  and another dose of TXA. The patient will be out of bed as tolerated with no brace. The patient will work with physical therapy.  The patient will likely discharge to home tomorrow.    Ozell Ada, MD Orthopedic Surgeon

## 2023-09-08 NOTE — Discharge Summary (Signed)
 Orthopedic Surgery Discharge Summary  Patient name: Vanessa [name] Patient MRN: [id number] Admit today: 09/08/2023 Discharge date: 09/09/2023  Attending physician: Ozell Ada, MD Final diagnosis: lumbar stenosis with neurogenic claudication Findings: L4/5 hypertrophic facets and thickened ligamentum flavum   Hospital course: Patient is a 73 y.o. female who was admitted after undergoing L4/5 laminectomy. The patient had significant pain immediately after surgery, but pain eventually was controlled with a multimodal regimen including oxycodone . Labs during the hospitalization revealed elevated creatinine consistent with her chronic kidney disease stage 3b (no change from baseline). The patient worked with physical therapy who recommended discharge to home. The patient was tolerating an oral diet without issue and was voiding spontaneously after surgery. The patient's vitals were stable on the day of discharge. The patient was medically ready for discharge and was discharge to home on post-operative day one.  Instructions:   Orthopedic Surgery Discharge Instructions  Patient name: Vanessa [name] Procedure Performed: L4/5 laminectomy Date of Surgery: 09/08/2023 Surgeon: Ozell Ada, MD  Pre-operative Diagnosis: lumbar stenosis with neurogenic claudication Post-operative Diagnosis: same as above  Discharge Date: 09/09/2023 Discharged to: home Discharge Condition: stable  Activity: You should refrain from bending, lifting, or twisting with objects greater than ten pounds until six weeks after surgery. You are encouraged to walk as much as desired. You can perform household activities such as cleaning dishes, doing laundry, vacuuming, etc. as long as the ten-pound restriction is followed. You do not need to wear a brace during the post-operative period.   Incision Care: Your incision site has a dressing over it. That dressing should remain in place and dry at all times for a  total of one week after surgery. After one week, you can remove the dressing. Underneath the dressing, you will find skin glue. You should leave the skin glue in place. It will fall off with time. Do not pick, rub, or scrub at it. Do not put cream or lotion over the surgical area. After one week and once the dressing is off, it is okay to let soap and water  run over your incision. Again, do not pick, scrub, or rub at the pieces of tape when bathing. Do not submerge (e.g., take a bath, swim, go in a hot tub, etc.) until six weeks after surgery. There may be some bloody drainage from the incision into the dressing after surgery. This is normal. You do not need to replace the dressing. Continue to leave it in place for the one week as instructed above. Should the dressing become saturated with blood or drainage, please call the office for further instructions.   Medications: You have been prescribed oxycodone . This is a narcotic pain medication and should only be taken as prescribed. You should not drink alcohol or operate heavy machinery (including driving) while taking this medication. The oxycodone  can cause constipation as a side effect. For that reason, you have been prescribed senna and miralax . These are both laxatives. You do not need to take this medication if you develop diarrhea. Should you remain constipated even while taking these medications, please increase the dose of miralax  to twice daily. Tylenol  has been prescribed to be taken every 8 hours, which will give you additional pain relief. Robaxin  is a muscle relaxer that has been prescribed to you for muscle spasm type pain. Take this medication as needed.   In order to set expectations for opioid prescriptions, you will only be prescribed opioids for a total of six weeks after surgery and, at  two-weeks after surgery, your opioid prescription will start to tapered (decreased dosage and number of pills). If you have ongoing need for opioid medication  six weeks after surgery, you will be referred to pain management. If you are already established with a provider that is giving you opioid medications, you should schedule an appointment with them for six weeks after surgery if you feel you are going to need another prescription. State law only allows for opioid prescriptions one week at a time. If you are running out of opioid medication near the end of the week, please call the office during business hours before running out so I can send you another prescription.   You may resume any home blood thinners (warfarin, lovenox, apixaban, plavix, xarelto, etc) 72 hours after your surgery. Take these medications as they were previously prescribed.  Driving: You should not drive while taking narcotic pain medications. You should start getting back to driving slowly and you may want to try driving in a parking lot before doing anything more.   Diet: You are safe to resume your regular diet after surgery.   Reasons to Call the Office After Surgery: You should feel free to call the office with any concerns or questions you have in the post-operative period, but you should definitely notify the office if you develop: -shortness of breath, chest pain, or trouble breathing -excessive bleeding, drainage, redness, or swelling around the surgical site -fevers, chills, or pain that is getting worse with each passing day -persistent nausea or vomiting -new weakness in either leg -new or worsening numbness or tingling in either leg -numbness in the groin, bowel or bladder incontinence -other concerns about your surgery  Follow Up Appointments: You should have an office appointment scheduled for approximately two weeks after surgery. If you do not remember when this appointment is or do not already have it scheduled, please call the office to schedule.   Office Information:  -Ozell Ada, MD -Phone number: [phone] -Address: [address]        [place], [place] [address]

## 2023-09-08 NOTE — Brief Op Note (Signed)
 09/08/2023  10:55 AM  PATIENT:  Vanessa [name]  73 y.o. female  PRE-OPERATIVE DIAGNOSIS:  LUMBAR STENOSIS WITH NEURGENIC CLAUDICATION  POST-OPERATIVE DIAGNOSIS:  LUMBAR STENOSIS WITH NEURGENIC CLAUDICATION  PROCEDURE:  Procedure(s) with comments: DECOMPRESSIVE LUMBAR LAMINECTOMY LEVEL 1 (N/A) - L4-5 LAMINECTOMY  SURGEON:  Surgeons and Role:    * Georgina Ozell LABOR, MD - Primary  PHYSICIAN ASSISTANT: none  ASSISTANTS: none   ANESTHESIA:   general  EBL:  50 mL   BLOOD ADMINISTERED:none  DRAINS: none   LOCAL MEDICATIONS USED:  MARCAINE      SPECIMEN:  No Specimen  DISPOSITION OF SPECIMEN:  N/A  COUNTS:  YES  TOURNIQUET:  NONE  DICTATION: .Note written in EPIC  PLAN OF CARE: Overnight observation  PATIENT DISPOSITION:  PACU - hemodynamically stable.   Delay start of Pharmacological VTE agent (>24hrs) due to surgical blood loss or risk of bleeding: yes

## 2023-09-08 NOTE — Anesthesia Postprocedure Evaluation (Signed)
 Anesthesia Post Note  Patient: Vanessa [name]  Procedure(s) Performed: DECOMPRESSIVE LUMBAR LAMINECTOMY LEVEL 1 (Spine Lumbar)     Patient location during evaluation: PACU Anesthesia Type: General Level of consciousness: awake and alert Pain management: pain level controlled Vital Signs Assessment: post-procedure vital signs reviewed and stable Respiratory status: spontaneous breathing, nonlabored ventilation, respiratory function stable and patient connected to nasal cannula oxygen Cardiovascular status: blood pressure returned to baseline and stable Postop Assessment: no apparent nausea or vomiting Anesthetic complications: no   No notable events documented.  Last Vitals:  Vitals:   09/08/23 1130 09/08/23 1200  BP: 124/72 (P) 119/63  Pulse: 76 (P) 70  Resp: 15 (P) 18  Temp: 36.9 C (P) 36.7 C  SpO2: 95% (P) 98%    Last Pain:  Vitals:   09/08/23 1200  TempSrc: (P) Oral  PainSc:                  Vanessa [name]

## 2023-09-08 NOTE — Plan of Care (Signed)

## 2023-09-08 NOTE — Evaluation (Signed)
 Occupational Therapy Evaluation Patient Details Name: Vanessa [name] MRN: [id number] DOB: 1950-12-14 Today's Date: 09/08/2023   History of Present Illness   73 yo F s/p L4, L5 segment lumbar laminectomies with partial medial facetectomies.  PMH includes: Anxiety, History of endometrial cancer,  HLD  HTN  CAD  CKD  GERD.     Clinical Impressions Patient admitted for the procedure above.  PTA she lives at home with her SO, who is a Designer, fashion/clothing at [employer], and the patient is a retired Charity fundraiser.  Patient despite back pain remained Mod I with all ADL,iADL and mobility.  Patient did use a 4WRW in the home and a SPC in the community.  Currently she is having incisional discomfort, but is moving at a Mod I level with 2WRW in the halls, and without an AD in the room.  Patient demonstrates the ability to use figure four for lower body ADL, and was able to adjust her socks prior to mobility adhering to back precautions.  Back precaution sheet issued, good understanding of precautions, and no further OT needs exist in the acute setting.  Recommend follow up with MD as prescribed.       If plan is discharge home, recommend the following:   Assist for transportation;Assistance with cooking/housework     Functional Status Assessment   Patient has had a recent decline in their functional status and demonstrates the ability to make significant improvements in function in a reasonable and predictable amount of time.     Equipment Recommendations   None recommended by OT     Recommendations for Other Services         Precautions/Restrictions   Precautions Precautions: Back Precaution Booklet Issued: Yes (comment) Recall of Precautions/Restrictions: Intact Restrictions Weight Bearing Restrictions Per Provider Order: No     Mobility Bed Mobility Overal bed mobility: Needs Assistance Bed Mobility: Sidelying to Sit, Sit to Sidelying   Sidelying to sit: Supervision     Sit to  sidelying: Supervision      Transfers Overall transfer level: Needs assistance Equipment used: Rolling walker (2 wheels) Transfers: Sit to/from Stand, Bed to chair/wheelchair/BSC Sit to Stand: Modified independent (Device/Increase time)     Step pivot transfers: Modified independent (Device/Increase time)     General transfer comment: assist to manage IV pole.      Balance Overall balance assessment: No apparent balance deficits (not formally assessed)                                         ADL either performed or assessed with clinical judgement   ADL Overall ADL's : At baseline                                       General ADL Comments: Mild cues for lower body ADL, will have assist at home if needed.     Vision Patient Visual Report: No change from baseline       Perception Perception: Not tested       Praxis Praxis: Not tested       Pertinent Vitals/Pain Pain Assessment Pain Assessment: Faces Faces Pain Scale: Hurts a little bit Pain Location: Incisional Pain Descriptors / Indicators: Tender Pain Intervention(s): Monitored during session, Patient requesting pain meds-RN notified     Extremity/Trunk Assessment Upper  Extremity Assessment Upper Extremity Assessment: Overall WFL for tasks assessed   Lower Extremity Assessment Lower Extremity Assessment: Overall WFL for tasks assessed   Cervical / Trunk Assessment Cervical / Trunk Assessment: Back Surgery   Communication Communication Communication: No apparent difficulties   Cognition Arousal: Alert Behavior During Therapy: WFL for tasks assessed/performed Cognition: No apparent impairments             OT - Cognition Comments: Mild STM deficits                 Following commands: Intact       Cueing  General Comments   Cueing Techniques: Verbal cues   BP 94/67 and no dizziness noted   Exercises     Shoulder Instructions      Home Living  Family/patient expects to be discharged to:: Private residence Living Arrangements: Spouse/significant other Available Help at Discharge: Family;Available PRN/intermittently Type of Home: House Home Access: Stairs to enter Entrance Stairs-Number of Steps: 2   Home Layout: Two level;Able to live on main level with bedroom/bathroom Alternate Level Stairs-Number of Steps: full flight, does not need to access.   Bathroom Shower/Tub: Producer, television/film/video: Standard     Home Equipment: Educational psychologist (4 wheels);Cane - single point          Prior Functioning/Environment Prior Level of Function : Independent/Modified Independent;Driving                    OT Problem List: Pain   OT Treatment/Interventions:        OT Goals(Current goals can be found in the care plan section)   Acute Rehab OT Goals Patient Stated Goal: Return home tomorrow OT Goal Formulation: With patient Time For Goal Achievement: 09/15/23 Potential to Achieve Goals: Good   OT Frequency:       Co-evaluation              AM-PAC OT 6 Clicks Daily Activity     Outcome Measure Help from another person eating meals?: None Help from another person taking care of personal grooming?: None Help from another person toileting, which includes using toliet, bedpan, or urinal?: A Little Help from another person bathing (including washing, rinsing, drying)?: A Little Help from another person to put on and taking off regular upper body clothing?: None Help from another person to put on and taking off regular lower body clothing?: A Little 6 Click Score: 21   End of Session Equipment Utilized During Treatment: Rolling walker (2 wheels) Nurse Communication: Mobility status  Activity Tolerance: Patient tolerated treatment well Patient left: in chair;with call bell/phone within reach;with family/visitor present  OT Visit Diagnosis: Pain                Time: 8564-8495 OT Time  Calculation (min): 29 min Charges:  OT General Charges $OT Visit: 1 Visit OT Evaluation $OT Eval Moderate Complexity: 1 Mod OT Treatments $Self Care/Home Management : 8-22 mins  09/08/2023  RP, OTR/L  Acute Rehabilitation Services  Office:  [phone]   Charlie JONETTA Halsted 09/08/2023, 3:13 PM

## 2023-09-08 NOTE — Transfer of Care (Signed)
 Immediate Anesthesia Transfer of Care Note  Patient: Vanessa [name]  Procedure(s) Performed: DECOMPRESSIVE LUMBAR LAMINECTOMY LEVEL 1 (Spine Lumbar)  Patient Location: PACU  Anesthesia Type:General  Level of Consciousness: awake and alert   Airway & Oxygen Therapy: Patient Spontanous Breathing and Patient connected to face mask oxygen  Post-op Assessment: Report given to RN and Post -op Vital signs reviewed and stable  Post vital signs: Reviewed and stable  Last Vitals:  Vitals Value Taken Time  BP 147/73 09/08/23 10:48  Temp    Pulse 89 09/08/23 10:54  Resp 20 09/08/23 10:54  SpO2 95 % 09/08/23 10:54  Vitals shown include unfiled device data.  Last Pain:  Vitals:   09/08/23 0641  TempSrc:   PainSc: 4       Patients Stated Pain Goal: 3 (09/08/23 0641)  Complications: No notable events documented.

## 2023-09-08 NOTE — Anesthesia Procedure Notes (Signed)
 Procedure Name: Intubation Date/Time: 09/08/2023 7:46 AM  Performed by: Boyce Shilling, CRNAPre-anesthesia Checklist: Patient identified, Emergency Drugs available, Suction available, Timeout performed and Patient being monitored Patient Re-evaluated:Patient Re-evaluated prior to induction Oxygen Delivery Method: Circle system utilized Preoxygenation: Pre-oxygenation with 100% oxygen Induction Type: IV induction Ventilation: Mask ventilation without difficulty and Oral airway inserted - appropriate to patient size Laryngoscope Size: Mac and 3 Grade View: Grade II Tube type: Oral Tube size: 7.0 mm Number of attempts: 1 Airway Equipment and Method: Stylet Placement Confirmation: ETT inserted through vocal cords under direct vision, positive ETCO2, CO2 detector and breath sounds checked- equal and bilateral Secured at: 22 cm Tube secured with: Tape Dental Injury: Teeth and Oropharynx as per pre-operative assessment

## 2023-09-08 NOTE — Progress Notes (Signed)
 Orthopedic Surgery Post-operative Progress Note  Assessment: Patient is a 73 y.o. female who is currently admitted after undergoing L4/5 laminectomy   Plan: -Operative plans complete -Drains: none -Out of bed as tolerated, no brace -No bending/lifting/twisting greater than 10 pounds -OT evaluate and treat -Pain control -Regular diet -No chemoprophylaxis for dvt or antiplatelets for 72 hours after surgery -Ancef  x2 post-operative doses -Disposition: to floor from PACU  ___________________________________________________________________________   Subjective: No acute events since surgery. Recovering in PACU. Hacing back pain. No radiating leg pain. Pain well controlled.   Objective:  General: no acute distress, appropriate affect Neurologic: alert, answering questions appropriately, following commands Respiratory: unlabored breathing on room air Skin: dressing clear/dry/intact  MSK (spine):  -Strength exam      Right  Left  EHL    5/5  5/5 TA    5/5  5/5 GSC    5/5  5/5 Knee extension  5/5  5/5 Hip flexion   5/5  5/5  -Sensory exam    Sensation intact to light touch in L3-S1 nerve distributions of bilateral lower extremities   Patient name: Vanessa [name] Patient MRN: [id number] Date: 09/08/23

## 2023-09-09 ENCOUNTER — Encounter (HOSPITAL_COMMUNITY): Payer: Self-pay | Admitting: Orthopedic Surgery

## 2023-09-09 DIAGNOSIS — M48062 Spinal stenosis, lumbar region with neurogenic claudication: Secondary | ICD-10-CM | POA: Diagnosis not present

## 2023-09-09 LAB — BASIC METABOLIC PANEL WITH GFR
Anion gap: 11 (ref 5–15)
BUN: 20 mg/dL (ref 8–23)
CO2: 21 mmol/L — ABNORMAL LOW (ref 22–32)
Calcium: 9.3 mg/dL (ref 8.9–10.3)
Chloride: 103 mmol/L (ref 98–111)
Creatinine, Ser: 1.67 mg/dL — ABNORMAL HIGH (ref 0.44–1.00)
GFR, Estimated: 32 mL/min — ABNORMAL LOW (ref >60)
Glucose, Bld: 132 mg/dL — ABNORMAL HIGH (ref 70–99)
Potassium: 3.9 mmol/L (ref 3.5–5.1)
Sodium: 135 mmol/L (ref 135–145)

## 2023-09-09 NOTE — Plan of Care (Signed)

## 2023-09-09 NOTE — Plan of Care (Signed)
  Problem: Education: Goal: Knowledge of General Education information will improve Description: Including pain rating scale, medication(s)/side effects and non-pharmacologic comfort measures 09/09/2023 0937 by Berkeley Verdie BRAVO, RN Outcome: Adequate for Discharge 09/09/2023 0936 by Berkeley Verdie BRAVO, RN Outcome: Progressing   Problem: Health Behavior/Discharge Planning: Goal: Ability to manage health-related needs will improve Outcome: Adequate for Discharge   Problem: Clinical Measurements: Goal: Ability to maintain clinical measurements within normal limits will improve Outcome: Adequate for Discharge Goal: Will remain free from infection Outcome: Adequate for Discharge Goal: Diagnostic test results will improve Outcome: Adequate for Discharge Goal: Respiratory complications will improve Outcome: Adequate for Discharge Goal: Cardiovascular complication will be avoided Outcome: Adequate for Discharge   Problem: Activity: Goal: Risk for activity intolerance will decrease 09/09/2023 0937 by Berkeley Verdie BRAVO, RN Outcome: Adequate for Discharge 09/09/2023 [phone] by Berkeley Verdie BRAVO, RN Outcome: Progressing   Problem: Nutrition: Goal: Adequate nutrition will be maintained Outcome: Adequate for Discharge   Problem: Coping: Goal: Level of anxiety will decrease Outcome: Adequate for Discharge   Problem: Elimination: Goal: Will not experience complications related to bowel motility Outcome: Adequate for Discharge Goal: Will not experience complications related to urinary retention Outcome: Adequate for Discharge   Problem: Pain Managment: Goal: General experience of comfort will improve and/or be controlled 09/09/2023 0937 by Berkeley Verdie BRAVO, RN Outcome: Adequate for Discharge 09/09/2023 0936 by Berkeley Verdie BRAVO, RN Outcome: Progressing   Problem: Safety: Goal: Ability to remain free from injury will improve Outcome: Adequate for Discharge   Problem: Skin Integrity: Goal: Risk for  impaired skin integrity will decrease Outcome: Adequate for Discharge

## 2023-09-09 NOTE — Care Management Obs Status (Signed)
 MEDICARE OBSERVATION STATUS NOTIFICATION   Patient Details  Name: Vanessa [name] MRN: [id number] Date of Birth: February 04, 1951   Medicare Observation Status Notification Given:  Yes    Robynn Eileen Hoose, RN 09/09/2023, 8:55 AM

## 2023-09-09 NOTE — Progress Notes (Signed)
 Orthopedic Surgery Post-operative Progress Note  Assessment: Patient is a 73 y.o. female who is currently admitted after undergoing L4/5 laminectomy   Plan: -Operative plans complete -Drains: none -Out of bed as tolerated, no brace -No bending/lifting/twisting greater than 10 pounds -OT evaluate and treat -Pain control -Regular diet -No chemoprophylaxis for dvt or antiplatelets for 72 hours after surgery -Ancef  x2 post-operative doses -Anticipate discharge to home  ___________________________________________________________________________   Subjective: No acute events overnight. Has walked the halls several times since surgery. Pain well controled with current medications. No complaints this morning. Hoping to go home this morning.   Objective:  General: no acute distress, appropriate affect Neurologic: alert, answering questions appropriately, following commands Respiratory: unlabored breathing on room air Skin: dressing clear/dry/intact  MSK (spine):  -Strength exam      Right  Left  EHL    5/5  5/5 TA    5/5  5/5 GSC    5/5  5/5 Knee extension  5/5  5/5 Hip flexion   5/5  5/5  -Sensory exam    Sensation intact to light touch in L3-S1 nerve distributions of bilateral lower extremities   Patient name: Vanessa [name] Patient MRN: [id number] Date: 09/09/23

## 2023-09-09 NOTE — Progress Notes (Signed)
 Patient verbalized understanding of dc instructions. Toc meds and belongings with patient.

## 2023-09-09 NOTE — Plan of Care (Signed)
   Problem: Education: Goal: Knowledge of General Education information will improve Description: Including pain rating scale, medication(s)/side effects and non-pharmacologic comfort measures Outcome: Progressing   Problem: Activity: Goal: Risk for activity intolerance will decrease Outcome: Progressing   Problem: Pain Managment: Goal: General experience of comfort will improve and/or be controlled Outcome: Progressing

## 2023-09-13 ENCOUNTER — Other Ambulatory Visit: Payer: Self-pay

## 2023-09-13 MED ORDER — METOPROLOL SUCCINATE ER 25 MG PO TB24: 25.0000 mg | ORAL_TABLET | Freq: Every day | ORAL | 1 refills | Status: DC

## 2023-09-13 MED ORDER — ATORVASTATIN CALCIUM 80 MG PO TABS: 80.0000 mg | ORAL_TABLET | Freq: Every day | ORAL | 1 refills | Status: AC

## 2023-09-21 ENCOUNTER — Encounter: Payer: Self-pay | Admitting: Orthopedic Surgery

## 2023-09-21 ENCOUNTER — Ambulatory Visit (INDEPENDENT_AMBULATORY_CARE_PROVIDER_SITE_OTHER): Admitting: Orthopedic Surgery

## 2023-09-21 DIAGNOSIS — G5601 Carpal tunnel syndrome, right upper limb: Secondary | ICD-10-CM

## 2023-09-21 DIAGNOSIS — Z9889 Other specified postprocedural states: Secondary | ICD-10-CM

## 2023-09-21 NOTE — Progress Notes (Signed)
 Orthopedic Surgery Post-operative Office Visit  Procedure: L4/5 laminectomy Date of Surgery: 09/08/2023 (~2 weeks post-op)  Assessment: Patient is a 73 y.o. who has noticed resolution of her radiating leg pain since surgery   Plan: -Operative plans complete -Out of bed as tolerated, no brace -No bending/lifting/twisting greater than 10 pounds -Okay to let soap/water  run over the incision but do not submerge -Pain management: tylenol  as needed -Return to office in 4 weeks, lumbar x-rays needed at next visit: none  ___________________________________________________________________________   Subjective: Patient has been doing well since discharge from the hospital.  She has not had any radiating leg pain.  She has been walking regularly.  Her back pain has been improving with time.  She has not noticed any redness or drainage around her incision.  No complaints at today's visit.  Objective:  General: no acute distress, appropriate affect Neurologic: alert, answering questions appropriately, following commands Respiratory: unlabored breathing on room air Skin: incision is well-approximated with no erythema, induration, active/expressible drainage  MSK (spine):  -Strength exam      Left  Right  EHL    5/5  5/5 TA    5/5  5/5 GSC    5/5  5/5 Knee extension  5/5  5/5 Hip flexion   5/5  5/5  -Sensory exam    Sensation intact to light touch in L3-S1 nerve distributions of bilateral lower extremities  Imaging: None obtained at today's visit   Patient name: Vanessa [name] Patient MRN: [id number] Date of visit: 09/21/23

## 2023-10-16 ENCOUNTER — Encounter: Payer: Self-pay | Admitting: Internal Medicine

## 2023-10-16 ENCOUNTER — Ambulatory Visit: Attending: Internal Medicine | Admitting: Internal Medicine

## 2023-10-16 VITALS — BP 148/72 | HR 66 | Ht 65.0 in | Wt 170.6 lb

## 2023-10-16 DIAGNOSIS — E785 Hyperlipidemia, unspecified: Secondary | ICD-10-CM | POA: Diagnosis not present

## 2023-10-16 DIAGNOSIS — E7841 Elevated Lipoprotein(a): Secondary | ICD-10-CM | POA: Insufficient documentation

## 2023-10-16 DIAGNOSIS — I3481 Nonrheumatic mitral (valve) annulus calcification: Secondary | ICD-10-CM | POA: Diagnosis present

## 2023-10-16 DIAGNOSIS — I358 Other nonrheumatic aortic valve disorders: Secondary | ICD-10-CM | POA: Diagnosis present

## 2023-10-16 DIAGNOSIS — R931 Abnormal findings on diagnostic imaging of heart and coronary circulation: Secondary | ICD-10-CM | POA: Insufficient documentation

## 2023-10-16 DIAGNOSIS — N1832 Chronic kidney disease, stage 3b: Secondary | ICD-10-CM | POA: Diagnosis present

## 2023-10-16 DIAGNOSIS — I491 Atrial premature depolarization: Secondary | ICD-10-CM | POA: Insufficient documentation

## 2023-10-16 NOTE — Progress Notes (Signed)
 OFFICE NOTE  Chief Complaint:  Establish cardiologist  Primary Care Physician: Sari Bar  HPI:  Vanessa [name] is a 73 y.o. female with a past medial history significant for hypertension, hypothyroidism, dyslipidemia, stage IIIb chronic kidney disease and elevated LP(a).  She has been followed recently by Dr. Charlena Sor but is now transferring care to me after his retirement.  She has a history of elevated coronary artery calcium  score in August 2024 which was 222 (81st percentile).  This involve left main, LAD, circumflex and RCA calcium .  She had been on atorvastatin  40 mg daily but this was increased up to 80 mg daily and eventually ezetimibe  10 mg daily was added.  She was found to have a high LP(a) of 404.8 nmol/L.  She had an echo in November 2024 which showed normal LVEF 55 to 60% and grade 1 diastolic dysfunction.  There is severe mitral annular calcification with only mild mitral stenosis.  The aortic valve is sclerotic but not stenotic.  Today in follow-up she says she feels well.  She had recent lumbar fusion surgery and is recovering from that.  She has not had repeat lipid assessment since February 2025 at which time her total cholesterol was 170, HDL 64, LDL 77 and triglycerides 171.  Her goal LDL being less than 55.  PMHx:  Past Medical History:  Diagnosis Date   Aortic valve sclerosis    Arthritis    osteoarthritis   Back pain    Cancer (HCC)    endometrial   Chronic kidney disease    stage 3   Depression    GERD (gastroesophageal reflux disease)    Heart murmur    AV sclerosis; 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 but no obvious stenosis or regurgitation 12/04/17 echo ([facility])   Hepatitis    History of kidney stones    Hypertension    Hypothyroidism    PONV (postoperative nausea and vomiting)     Past Surgical History:  Procedure Laterality Date   ABDOMINAL HYSTERECTOMY  2008   BREAST SURGERY Right 06/20/2011   lumpectomy for atypical ductal  hyperplasia   DECOMPRESSIVE LUMBAR LAMINECTOMY LEVEL 1 N/A 09/08/2023   Procedure: DECOMPRESSIVE LUMBAR LAMINECTOMY LEVEL 1;  Surgeon: Georgina Ozell LABOR, MD;  Location: [facility] OR;  Service: Orthopedics;  Laterality: N/A;  L4-5 LAMINECTOMY   SHOULDER ARTHROSCOPY WITH ROTATOR CUFF REPAIR AND SUBACROMIAL DECOMPRESSION Right 01/29/2021   Procedure: RIGHT SHOULDER ARTHROSCOPY, DEBRIDEMENT, WITH MINI OPEN ROTATOR CUFF TEAR REPAIR & BICEPS TENODESIS;  Surgeon: Addie Cordella Hamilton, MD;  Location: [facility] OR;  Service: Orthopedics;  Laterality: Right;   TONSILLECTOMY     TUBAL LIGATION      FAMHx:  No family history on file.  SOCHx:   reports that she has quit smoking. Her smoking use included cigarettes. She has never used smokeless tobacco. She reports current alcohol use. She reports that she does not use drugs.  ALLERGIES:  Allergies  Allergen Reactions   Lisinopril Cough    Other Reaction(s): cough   Morphine     GI Upset (intolerance)  Other Reaction(s): GI Intolerance, vomiting   Codeine     Upset stomach    ROS: Pertinent items noted in HPI and remainder of comprehensive ROS otherwise negative.  HOME MEDS: Current Outpatient Medications on File Prior to Visit  Medication Sig Dispense Refill   atorvastatin  (LIPITOR) 80 MG tablet Take 1 tablet (80 mg total) by mouth daily. 90 tablet 1   [facility] ASPIRIN  ADULT LOW DOSE  81 MG chewable tablet CHEW 1 TABLET BY MOUTH DAILY. 36 tablet 0   Cyanocobalamin (B-12 PO) Take 1 capsule by mouth 3 (three) times a week.     ezetimibe  (ZETIA ) 10 MG tablet Take 1 tablet (10 mg total) by mouth daily. 90 tablet 3   famotidine  (PEPCID ) 20 MG tablet Take 20 mg by mouth 2 (two) times daily.     furosemide  (LASIX ) 20 MG tablet Take 20 mg by mouth daily.     hydrALAZINE  (APRESOLINE ) 100 MG tablet Take 100 mg by mouth 3 (three) times daily.     levothyroxine  (SYNTHROID ) 150 MCG tablet Take 150 mcg by mouth daily before breakfast.     loratadine  (CLARITIN ) 10 MG tablet  Take 10 mg by mouth daily as needed for allergies.     metoprolol  succinate (TOPROL  XL) 25 MG 24 hr tablet Take 1 tablet (25 mg total) by mouth daily. 90 tablet 1   mirtazapine (REMERON) 15 MG tablet Take 15 mg by mouth at bedtime.     Multiple Vitamins-Minerals (HAIR SKIN AND NAILS FORMULA) TABS Take 1-2 tablets by mouth See admin instructions. Take 1 tablet on days when taking the b12 and 2 tablets on the non-b12 days     NIFEdipine  (PROCARDIA  XL/NIFEDICAL XL) 60 MG 24 hr tablet Take 60 mg by mouth 2 (two) times daily.     sodium bicarbonate  650 MG tablet Take 650 mg by mouth 2 (two) times daily.     venlafaxine  XR (EFFEXOR -XR) 75 MG 24 hr capsule Take 75 mg by mouth daily with breakfast.     VITAMIN D PO Take 1 capsule by mouth 3 (three) times a week.     lidocaine  4 % Place 1 patch onto the skin daily as needed (pain).     No current facility-administered medications on file prior to visit.    LABS/IMAGING: No results found for this or any previous visit (from the past 48 hours). No results found.  LIPID PANEL:    Component Value Date/Time   CHOL 170 04/05/2023 0809   TRIG 171 (H) 04/05/2023 0809   HDL 64 04/05/2023 0809   CHOLHDL 2.7 04/05/2023 0809   LDLCALC 77 04/05/2023 0809     WEIGHTS: Wt Readings from Last 3 Encounters:  10/16/23 170 lb 9.6 oz (77.4 kg)  09/08/23 170 lb (77.1 kg)  08/31/23 171 lb 8 oz (77.8 kg)    VITALS: BP (!) 148/72   Pulse 66   Ht 5' 5 (1.651 m)   Wt 170 lb 9.6 oz (77.4 kg)   SpO2 97%   BMI 28.39 kg/m   EXAM: General appearance: alert and no distress Lungs: clear to auscultation bilaterally Heart: regular rate and rhythm, S1, S2 normal, and systolic murmur: early systolic 2/6, blowing at apex Extremities: extremities normal, atraumatic, no cyanosis or edema Neurologic: Grossly normal  EKG: EKG Interpretation Date/Time:  Monday October 16 2023 09:48:14 EDT Ventricular Rate:  66 PR Interval:  174 QRS Duration:  66 QT  Interval:  412 QTC Calculation: 431 R Axis:   -35  Text Interpretation: Sinus rhythm with Premature supraventricular complexes Left axis deviation When compared with ECG of 04-Apr-2023 15:09, Premature supraventricular complexes are now Present Confirmed by Mona Kent [phone]) on 10/16/2023 10:08:13 AM - personally reviewed  ASSESSMENT: CAC score of 222, 81st percentile (2024) Elevated LP(a)-404.8 nmol/L Dyslipidemia, goal LDL less than 55 Aortic atherosclerosis Hypertension LVEF 55 to 60%, moderate LAE, mild MS with severe MAC (12/2022) Stage IIIb CKD  PLAN: 1.   Ms. bastedo has an elevated coronary calcium  score and a high LP(a).  She has been on combination therapy for lipid-lowering with a target LDL less than 55.  Will plan to repeat her lipids to see if she has reached goal.  She might be an excellent candidate for upcoming clinical research trial for LP(a) lowering given her coronary artery calcification and high LP(a), except for possibly her chronic kidney disease.  She said she may be interested in that study.  Plan otherwise follow-up in 6 months or sooner as necessary.  Vinie KYM Maxcy, MD, [facility], FNLA, FACP  [place]  [facility]  Medical Director of the Advanced Lipid Disorders &  Cardiovascular Risk Reduction Clinic Diplomate of the American Board of Clinical Lipidology Attending Cardiologist  Direct Dial: [phone]  Fax: [phone]  Website:  [email or web address] Vinie JAYSON Maxcy 10/16/2023, 4:47 PM

## 2023-10-16 NOTE — Patient Instructions (Signed)
 Medication Instructions:  NO CHANGES  Lab Work: FASTING NMR lipoprofile If you have labs (blood work) drawn today and your tests are completely normal, you will receive your results only by: MyChart Message (if you have MyChart) OR A paper copy in the mail If you have any lab test that is abnormal or we need to change your treatment, we will call you to review the results.  Follow-Up: At [facility], you and your health needs are our priority.  As part of our continuing mission to provide you with exceptional heart care, our providers are all part of one team.  This team includes your primary Cardiologist (physician) and Advanced Practice Providers or APPs (Physician Assistants and Nurse Practitioners) who all work together to provide you with the care you need, when you need it.  Your next appointment:   6 months with Dr. Mona  We recommend signing up for the patient portal called MyChart.  Sign up information is provided on this After Visit Summary.  MyChart is used to connect with patients for Virtual Visits (Telemedicine).  Patients are able to view lab/test results, encounter notes, upcoming appointments, etc.  Non-urgent messages can be sent to your provider as well.   To learn more about what you can do with MyChart, go to [email or web address].

## 2023-10-17 ENCOUNTER — Ambulatory Visit (AMBULATORY_SURGERY_CENTER)

## 2023-10-17 VITALS — Ht 65.0 in | Wt 170.0 lb

## 2023-10-17 DIAGNOSIS — Z860100 Personal history of colon polyps, unspecified: Secondary | ICD-10-CM

## 2023-10-17 NOTE — Progress Notes (Signed)
 No egg or soy allergy known to patient  No issues known to pt with past sedation with any surgeries or procedures Patient denies ever being told they had issues or difficulty with intubation  No FH of Malignant Hyperthermia Pt is not on diet pills Pt is not on  home 02  Pt is not on blood thinners  occ constipation uses OTC colace as needed  No A fib or A flutter Have any cardiac testing pending-- no  LOA: independent  Prep: miralax    Patient's chart reviewed by Norleen Schillings CNRA prior to previsit and patient appropriate for the [facility].  Previsit completed and red dot placed by patient's name on their procedure day (on provider's schedule).     PV completed with patient. Prep instructions sent via mychart and home address.

## 2023-10-18 ENCOUNTER — Ambulatory Visit: Admitting: Orthopedic Surgery

## 2023-10-18 DIAGNOSIS — G5601 Carpal tunnel syndrome, right upper limb: Secondary | ICD-10-CM

## 2023-10-18 LAB — NMR, LIPOPROFILE
Cholesterol, Total: 172 mg/dL (ref 100–199)
HDL Particle Number: 33.2 umol/L (ref >=30.5)
HDL-C: 55 mg/dL (ref >39)
LDL Particle Number: 982 nmol/L (ref <1000)
LDL Size: 21.3 nm (ref >20.5)
LDL-C (NIH Calc): 86 mg/dL (ref 0–99)
LP-IR Score: <25 (ref <=45)
Small LDL Particle Number: 326 nmol/L (ref <=527)
Triglycerides: 185 mg/dL — ABNORMAL HIGH (ref 0–149)

## 2023-10-18 NOTE — Progress Notes (Signed)
 Vanessa [name] - 73 y.o. female MRN [id number]  Date of birth: 05/19/1950  Office Visit Note: Visit Date: 10/18/2023 PCP: Sari Bar Referred by: Georgina Ozell LABOR, MD  Subjective: No chief complaint on file.  HPI: Vanessa [name] is a pleasant 73 y.o. female who presents today for specific hand surgical evaluation for ongoing right sided carpal tunnel syndrome.  Symptoms have been present now for multiple months.  She was seen recently by Herlene Calix, PA who performed cortisone injection to the right carpal tunnel.  She was also fitted for a wrist brace which she is using occasionally.  States that her symptoms have improved after the injection, does have some persistent numbness in the long finger.  There are associated nocturnal symptoms as well.  Pertinent ROS were reviewed with the patient and found to be negative unless otherwise specified above in HPI.   Visit Reason: Right carpal tunnel syndrome Duration of symptoms: 2 months Hand dominance: right Occupation: retired Diabetic: No Smoking: No Heart/Lung History: none Blood Thinners:  aspirin   Prior Testing/EMG:  Injections (Date): 09/01/23 Treatments: brace sometimes Prior Surgery: lumbar laminectomy 09/08/23  Assessment & Plan: Visit Diagnoses:  1. Carpal tunnel syndrome, right upper limb     Plan: Extensive discussion was had with the patient today about her ongoing right sided carpal tunnel syndrome that is refractory to conservative care.  Patient has both clinical evidence to confirm this diagnosis with a carpal tunnel injection with interval success.    However, since some symptoms have persisted/recurred, at this juncture, she is indicated for right open versus endoscopic carpal tunnel release.  Risks and benefits of both operations were discussed in detail today.  Understanding all risks and benefits, patient would like to have surgery done in the form of right open carpal tunnel release under  local anesthesia in the office setting.  Risks include but not limited to infection, bleeding, scarring, stiffness, nerve injury or vascular, tendon injury, risk of recurrence and need for subsequent operation were all discussed in detail.  Patient consented understanding the above.  She would like to have surgery scheduled for later in the year.    Follow-up: No follow-ups on file.   Meds & Orders: No orders of the defined types were placed in this encounter.  No orders of the defined types were placed in this encounter.    Procedures: No procedures performed      Clinical History: MRI LUMBAR SPINE WITHOUT CONTRAST   TECHNIQUE: Multiplanar, multisequence MR imaging of the lumbar spine was performed. No intravenous contrast was administered.   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 The last full/open intervertebral disc space is labeled L5-S1 and there is a small disc space at S1-2.   Alignment:  Degenerative anterolisthesis of L5.   Vertebrae:  Normal marrow signal.  No bone lesions or fractures.   Conus medullaris and cauda equina: Conus extends to the L1 level. Conus and cauda equina appear normal.   Paraspinal and other soft tissues: No significant paraspinal or retroperitoneal findings.   Disc levels:   T12-L1: No significant findings. Small right-sided perineural cyst or dilated nerve root sheath.   L1-2: Mild facet disease and ligamentum flavum thickening but no significant spinal or foraminal stenosis.   L2-3: Mild annular bulge and moderate facet disease contributing to mild bilateral lateral recess encroachment. No spinal or foraminal stenosis.   L3-4: Bulging annulus, facet disease and  ligamentum flavum thickening contributing to early spinal stenosis and mild bilateral lateral recess stenosis. No foraminal stenosis.   L4-5: Bulging uncovered disc, short  pedicles, advanced facet disease and ligamentum flavum thickening all contributing to severe spinal and bilateral lateral recess stenosis. No significant foraminal stenosis.   L5-S1: Degenerative anterolisthesis of L5 due to severe facet disease. There is a bulging uncovered disc in conjunction with advanced facet disease and ligamentum flavum thickening contributing to severe spinal and bilateral lateral recess stenosis. No foraminal stenosis.   IMPRESSION: 1. 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 The last full/open intervertebral disc space is labeled L5-S1 and there is a small disc space at S1-2. 2. Mild bilateral lateral recess encroachment at L2-3. 3. Early spinal stenosis and mild bilateral lateral recess stenosis at L3-4. 4. Severe multifactorial spinal and bilateral lateral recess stenosis at L4-5. 5. Severe multifactorial spinal and bilateral lateral recess stenosis at L5-S1.     Electronically Signed   By: MYRTIS Stammer M.D.   On: 05/20/2023 14:47  She reports that she has quit smoking. Her smoking use included cigarettes. She has never used smokeless tobacco. No results for input(s): HGBA1C, LABURIC in the last 8760 hours.  Objective:   Vital Signs: There were no vitals taken for this visit.  Physical Exam  Gen: Well-appearing, in no acute distress; non-toxic CV: Regular Rate. Well-perfused. Warm.  Resp: Breathing unlabored on room air; no wheezing. Psych: Fluid speech in conversation; appropriate affect; normal thought process  Ortho Exam PHYSICAL EXAM:  General: Patient is well appearing and in no distress.   Skin and Muscle: No significant skin changes are apparent to upper extremities.   Range of Motion and Palpation Tests: Mobility is full about the elbows with flexion and extension. Forearm supination and pronation are 85/85 bilaterally.  Wrist flexion/extension is 75/65 bilaterally.  Digital flexion and extension are full.   Thumb opposition is full to the base of the small fingers bilaterally.    No cords or nodules are palpated.  No triggering is observed.    Neurologic, Vascular, Motor: Sensation is diminished to light touch in the right long finger   Thenar atrophy: Negative Tinel sign: Positive right carpal tunnel Carpal tunnel compression: Positive right Phalen test: Positive right  Motor right hand FPL: 5/5 Index FDP: 5/5 APB: 5/5  Fingers pink and well perfused.  Capillary refill is brisk.     No results found for: HGBA1C   Imaging: No results found.  Past Medical/Family/Surgical/Social History: Medications & Allergies reviewed per EMR, new medications updated. Patient Active Problem List   Diagnosis Date Noted   Lumbar stenosis with neurogenic claudication 09/08/2023   Degenerative spondylolisthesis 04/25/2023   Neural foraminal stenosis of lumbar spine 04/25/2023   Complete tear of right rotator cuff    Biceps tendonitis on right    Degenerative superior labral anterior-to-posterior (SLAP) tear of right shoulder    Unilateral primary osteoarthritis, right knee 05/13/2020   Past Medical History:  Diagnosis Date   Aortic valve sclerosis    Arthritis    osteoarthritis   Back pain    Cancer (HCC)    endometrial   Chronic kidney disease    stage 3   Depression    GERD (gastroesophageal reflux disease)    Heart murmur    AV sclerosis; 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 but no obvious stenosis or regurgitation 12/04/17 echo ([facility])   Hepatitis    History of kidney stones    Hypertension  Hypothyroidism    PONV (postoperative nausea and vomiting)    Family History  Problem Relation Age of Onset   Colon cancer Neg Hx    Rectal cancer Neg Hx    Stomach cancer Neg Hx    Past Surgical History:  Procedure Laterality Date   ABDOMINAL HYSTERECTOMY  2008   BREAST SURGERY Right 06/20/2011   lumpectomy for atypical ductal hyperplasia   DECOMPRESSIVE LUMBAR LAMINECTOMY  LEVEL 1 N/A 09/08/2023   Procedure: DECOMPRESSIVE LUMBAR LAMINECTOMY LEVEL 1;  Surgeon: Georgina Ozell LABOR, MD;  Location: [facility] OR;  Service: Orthopedics;  Laterality: N/A;  L4-5 LAMINECTOMY   SHOULDER ARTHROSCOPY WITH ROTATOR CUFF REPAIR AND SUBACROMIAL DECOMPRESSION Right 01/29/2021   Procedure: RIGHT SHOULDER ARTHROSCOPY, DEBRIDEMENT, WITH MINI OPEN ROTATOR CUFF TEAR REPAIR & BICEPS TENODESIS;  Surgeon: Addie Cordella Hamilton, MD;  Location: [facility] OR;  Service: Orthopedics;  Laterality: Right;   TONSILLECTOMY     TUBAL LIGATION     Social History   Occupational History   Not on file  Tobacco Use   Smoking status: Former    Types: Cigarettes   Smokeless tobacco: Never  Vaping Use   Vaping status: Never Used  Substance and Sexual Activity   Alcohol use: Yes    Comment: 1-2 drinks/wk when not taking Tramadol   Drug use: Never   Sexual activity: Not on file    Heena Woodbury Afton Alderton, M.D. [facility], Hand Surgery

## 2023-10-23 ENCOUNTER — Ambulatory Visit (INDEPENDENT_AMBULATORY_CARE_PROVIDER_SITE_OTHER): Admitting: Orthopedic Surgery

## 2023-10-23 DIAGNOSIS — Z9889 Other specified postprocedural states: Secondary | ICD-10-CM

## 2023-10-23 NOTE — Progress Notes (Signed)
 Orthopedic Surgery Post-operative Office Visit   Procedure: L4/5 laminectomy Date of Surgery: 09/08/2023 (~6 weeks post-op)   Assessment: Patient is a 73 y.o. who has noticed resolution of her radiating leg pain since surgery     Plan: -No spine specific precautions -Okay to submerge wound at this point -Pain management: tylenol  as needed -Return to office in 6 weeks, x-rays needed at next visit: AP/lateral/flex/ex lumbar   ___________________________________________________________________________     Subjective: Patient is doing well at this point.  She is not having any back or radiating leg pain.  She has not noticed any redness or drainage around her incision.  She is pleased with how she is doing since surgery.   Objective:   General: no acute distress, appropriate affect Neurologic: alert, answering questions appropriately, following commands Respiratory: unlabored breathing on room air Skin: incision is well healed with no erythema, induration, active/expressible drainage   MSK (spine):   -Strength exam                                                   Left                  Right   EHL                              5/5                  5/5 TA                                 5/5                  5/5 GSC                             5/5                  5/5 Knee extension            5/5                  5/5 Hip flexion                    5/5                  5/5   -Sensory exam                           Sensation intact to light touch in L3-S1 nerve distributions of bilateral lower extremities   Imaging: None obtained at today's visit     Patient name: Vanessa [name] Patient MRN: [id number] Date of visit: 10/23/23

## 2023-10-26 ENCOUNTER — Ambulatory Visit: Payer: Self-pay | Admitting: Internal Medicine

## 2023-10-28 ENCOUNTER — Other Ambulatory Visit (HOSPITAL_COMMUNITY): Payer: Self-pay

## 2023-10-28 MED ORDER — FLUZONE HIGH-DOSE 0.5 ML IM SUSY
0.5000 mL | PREFILLED_SYRINGE | Freq: Once | INTRAMUSCULAR | 0 refills | Status: AC
  Filled 2023-10-28 (×2): qty 0.5, 1d supply, fill #0

## 2023-10-28 MED ORDER — COVID-19 MRNA VAC-TRIS(PFIZER) 30 MCG/0.3ML IM SUSY
0.3000 mL | PREFILLED_SYRINGE | Freq: Once | INTRAMUSCULAR | 0 refills | Status: AC
  Filled 2023-10-28: qty 0.3, 1d supply, fill #0

## 2023-10-30 ENCOUNTER — Other Ambulatory Visit (HOSPITAL_COMMUNITY): Payer: Self-pay

## 2023-10-31 ENCOUNTER — Encounter: Payer: Self-pay | Admitting: Internal Medicine

## 2023-10-31 ENCOUNTER — Ambulatory Visit (AMBULATORY_SURGERY_CENTER): Admitting: Internal Medicine

## 2023-10-31 VITALS — BP 151/77 | HR 79 | Temp 97.7°F | Resp 14 | Ht 65.0 in | Wt 170.0 lb

## 2023-10-31 DIAGNOSIS — Z1211 Encounter for screening for malignant neoplasm of colon: Secondary | ICD-10-CM

## 2023-10-31 DIAGNOSIS — Z860100 Personal history of colon polyps, unspecified: Secondary | ICD-10-CM

## 2023-10-31 MED ORDER — SODIUM CHLORIDE 0.9 % IV SOLN: 500.0000 mL | Freq: Once | INTRAVENOUS | Status: DC

## 2023-10-31 NOTE — Progress Notes (Signed)
 HISTORY OF PRESENT ILLNESS:  Vanessa [name] is a 73 y.o. female with a history of colon polyps elsewhere.  Previous examinations possibly 2013 and 2018.  No further details.  Now for surveillance colonoscopy as previously recommended.  No active complaints  REVIEW OF SYSTEMS:  All non-GI ROS negative. Past Medical History:  Diagnosis Date   Aortic valve sclerosis    Arthritis    osteoarthritis   Back pain    Cancer (HCC)    endometrial   Chronic kidney disease    stage 3   Depression    GERD (gastroesophageal reflux disease)    Heart murmur    AV sclerosis; 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 but no obvious stenosis or regurgitation 12/04/17 echo ([facility])   Hepatitis    History of kidney stones    Hypertension    Hypothyroidism    PONV (postoperative nausea and vomiting)     Past Surgical History:  Procedure Laterality Date   ABDOMINAL HYSTERECTOMY  2008   BREAST SURGERY Right 06/20/2011   lumpectomy for atypical ductal hyperplasia   DECOMPRESSIVE LUMBAR LAMINECTOMY LEVEL 1 N/A 09/08/2023   Procedure: DECOMPRESSIVE LUMBAR LAMINECTOMY LEVEL 1;  Surgeon: Georgina Ozell LABOR, MD;  Location: [facility] OR;  Service: Orthopedics;  Laterality: N/A;  L4-5 LAMINECTOMY   SHOULDER ARTHROSCOPY WITH ROTATOR CUFF REPAIR AND SUBACROMIAL DECOMPRESSION Right 01/29/2021   Procedure: RIGHT SHOULDER ARTHROSCOPY, DEBRIDEMENT, WITH MINI OPEN ROTATOR CUFF TEAR REPAIR & BICEPS TENODESIS;  Surgeon: Addie Cordella Hamilton, MD;  Location: [facility] OR;  Service: Orthopedics;  Laterality: Right;   TONSILLECTOMY     TUBAL LIGATION      Social History Tenea Sens  reports that she has quit smoking. Her smoking use included cigarettes. She has never used smokeless tobacco. She reports current alcohol use. She reports that she does not use drugs.  family history is not on file.  Allergies  Allergen Reactions   Lisinopril Cough    Other Reaction(s): cough   Morphine     GI Upset  (intolerance)  Other Reaction(s): GI Intolerance, vomiting   Codeine Other (See Comments)    Upset stomach       PHYSICAL EXAMINATION: Vital signs: BP (!) 140/71   Pulse 82   Temp 97.7 F (36.5 C)   Ht 5' 5 (1.651 m)   Wt 170 lb (77.1 kg)   SpO2 97%   BMI 28.29 kg/m  General: Well-developed, well-nourished, no acute distress HEENT: Sclerae are anicteric, conjunctiva pink. Oral mucosa intact Lungs: Clear Heart: Regular Abdomen: soft, nontender, nondistended, no obvious ascites, no peritoneal signs, normal bowel sounds. No organomegaly. Extremities: No edema Psychiatric: alert and oriented x3. Cooperative     ASSESSMENT:  History of colon polyps   PLAN:  Surveillance colonoscopy

## 2023-10-31 NOTE — Op Note (Signed)
 [facility] Patient Name: Vanessa [name] Procedure Date: 10/31/2023 12:29 PM MRN: [id number] Endoscopist: Norleen SAILOR. Abran , MD, [id number] Age: 73 Referring MD:  Date of Birth: 1950-12-10 Gender: Female Account #: [id number] Procedure:                Colonoscopy Indications:              High risk colon cancer surveillance: Personal                            history of colonic polyps. Previous exams elsewhere                            2018, 2013. Polyp on 1 occasion, per patient. Medicines:                Monitored Anesthesia Care Procedure:                Pre-Anesthesia Assessment:                           - Prior to the procedure, a History and Physical                            was performed, and patient medications and                            allergies were reviewed. The patient's tolerance of                            previous anesthesia was also reviewed. The risks                            and benefits of the procedure and the sedation                            options and risks were discussed with the patient.                            All questions were answered, and informed consent                            was obtained. Prior Anticoagulants: The patient has                            taken no anticoagulant or antiplatelet agents.                            After reviewing the risks and benefits, the patient                            was deemed in satisfactory condition to undergo the                            procedure.  After obtaining informed consent, the colonoscope                            was passed under direct vision. Throughout the                            procedure, the patient's blood pressure, pulse, and                            oxygen saturations were monitored continuously. The                            Olympus Scope SN: [id number] was introduced through                            the anus and advanced to the  the cecum, identified                            by appendiceal orifice and ileocecal valve. The                            ileocecal valve, appendiceal orifice, and rectum                            were photographed. The quality of the bowel                            preparation was excellent. The colonoscopy was                            performed without difficulty. The patient tolerated                            the procedure well. The bowel preparation used was                            SUPREP via split dose instruction. Scope In: 1:55:34 PM Scope Out: 2:11:42 PM Scope Withdrawal Time: 0 hours 11 minutes 50 seconds  Total Procedure Duration: 0 hours 16 minutes 8 seconds  Findings:                 A few diverticula were found in the sigmoid colon.                           The exam was otherwise without abnormality on                            direct and retroflexion views. Complications:            No immediate complications. Estimated blood loss:                            None. Estimated Blood Loss:     Estimated blood loss: none. Impression:               -  Diverticulosis in the sigmoid colon.                           - The examination was otherwise normal on direct                            and retroflexion views.                           - No specimens collected. Recommendation:           - Repeat colonoscopy is not recommended for                            surveillance.                           - Patient has a contact number available for                            emergencies. The signs and symptoms of potential                            delayed complications were discussed with the                            patient. Return to normal activities tomorrow.                            Written discharge instructions were provided to the                            patient.                           - Resume previous diet.                           - Continue present  medications. Norleen SAILOR. Abran, MD 10/31/2023 2:19:17 PM This report has been signed electronically.

## 2023-10-31 NOTE — Progress Notes (Signed)
 Sedate, gd SR, tolerated procedure well, VSS, report to RN

## 2023-10-31 NOTE — Patient Instructions (Signed)
 Handouts given: Diverticulosis Resume previous diet. Continue present medications.   YOU HAD AN ENDOSCOPIC PROCEDURE TODAY AT THE [facility]:   Refer to the procedure report that was given to you for any specific questions about what was found during the examination.  If the procedure report does not answer your questions, please call your gastroenterologist to clarify.  If you requested that your care partner not be given the details of your procedure findings, then the procedure report has been included in a sealed envelope for you to review at your convenience later.  YOU SHOULD EXPECT: Some feelings of bloating in the abdomen. Passage of more gas than usual.  Walking can help get rid of the air that was put into your GI tract during the procedure and reduce the bloating. If you had a lower endoscopy (such as a colonoscopy or flexible sigmoidoscopy) you may notice spotting of blood in your stool or on the toilet paper. If you underwent a bowel prep for your procedure, you may not have a normal bowel movement for a few days.  Please Note:  You might notice some irritation and congestion in your nose or some drainage.  This is from the oxygen used during your procedure.  There is no need for concern and it should clear up in a day or so.  SYMPTOMS TO REPORT IMMEDIATELY:  Following lower endoscopy (colonoscopy or flexible sigmoidoscopy):  Excessive amounts of blood in the stool  Significant tenderness or worsening of abdominal pains  Swelling of the abdomen that is new, acute  Fever of 100F or higher   For urgent or emergent issues, a gastroenterologist can be reached at any hour by calling [phone]. Do not use MyChart messaging for urgent concerns.    DIET:  We do recommend a small meal at first, but then you may proceed to your regular diet.  Drink plenty of fluids but you should avoid alcoholic beverages for 24 hours.  ACTIVITY:  You should plan to take it easy for  the rest of today and you should NOT DRIVE or use heavy machinery until tomorrow (because of the sedation medicines used during the test).    FOLLOW UP: Our staff will call the number listed on your records the next business day following your procedure.  We will call around 7:15- 8:00 am to check on you and address any questions or concerns that you may have regarding the information given to you following your procedure. If we do not reach you, we will leave a message.     If any biopsies were taken you will be contacted by phone or by letter within the next 1-3 weeks.  Please call us  at [phone] if you have not heard about the biopsies in 3 weeks.    SIGNATURES/CONFIDENTIALITY: You and/or your care partner have signed paperwork which will be entered into your electronic medical record.  These signatures attest to the fact that that the information above on your After Visit Summary has been reviewed and is understood.  Full responsibility of the confidentiality of this discharge information lies with you and/or your care-partner.

## 2023-10-31 NOTE — Progress Notes (Signed)
 Pt's states no medical or surgical changes since previsit or office visit.

## 2023-11-01 ENCOUNTER — Other Ambulatory Visit: Payer: Self-pay

## 2023-11-01 ENCOUNTER — Telehealth: Payer: Self-pay

## 2023-11-01 MED ORDER — EZETIMIBE 10 MG PO TABS: 10.0000 mg | ORAL_TABLET | Freq: Every day | ORAL | 3 refills | Status: AC

## 2023-11-01 NOTE — Telephone Encounter (Signed)
 No answer, left message to call if having any issues or concerns, B.Vester Titsworth RN

## 2023-11-30 ENCOUNTER — Other Ambulatory Visit: Payer: Self-pay | Admitting: Obstetrics and Gynecology

## 2023-11-30 DIAGNOSIS — R928 Other abnormal and inconclusive findings on diagnostic imaging of breast: Secondary | ICD-10-CM

## 2023-12-04 ENCOUNTER — Ambulatory Visit (INDEPENDENT_AMBULATORY_CARE_PROVIDER_SITE_OTHER): Admitting: Orthopedic Surgery

## 2023-12-04 ENCOUNTER — Other Ambulatory Visit (INDEPENDENT_AMBULATORY_CARE_PROVIDER_SITE_OTHER): Payer: Self-pay

## 2023-12-04 DIAGNOSIS — Z9889 Other specified postprocedural states: Secondary | ICD-10-CM

## 2023-12-04 NOTE — Progress Notes (Signed)
 Orthopedic Surgery Post-operative Office Visit   Procedure: L4/5 laminectomy Date of Surgery: 09/08/2023 (~3 months post-op)   Assessment: Patient is a 73 y.o. who is doing well after surgery     Plan: -No spine specific precautions -Okay to submerge wound at this point -Pain management: tylenol  as needed -Return to office in 3 months, x-rays needed at next visit: none   ___________________________________________________________________________     Subjective: Patient is not having any back or leg pain.  She said that sometimes her back feels heavy.  She has been able to do everything that she wants to do at this point.  She is pleased with how her surgery has gone thus far.   Objective:   General: no acute distress, appropriate affect Neurologic: alert, answering questions appropriately, following commands Respiratory: unlabored breathing on room air Skin: incision is well healed   MSK (spine):   -Strength exam                                                   Left                  Right   EHL                              5/5                  5/5 TA                                 5/5                  5/5 GSC                             5/5                  5/5 Knee extension            5/5                  5/5 Hip flexion                    5/5                  5/5   -Sensory exam                           Sensation intact to light touch in L3-S1 nerve distributions of bilateral lower extremities   Imaging: XRs of the lumbar spine from 12/04/2023 were independently reviewed and interpreted, showing disc at loss at L5/S1.  Laminectomy defect at L4/5.  No evidence of instability on flexion/extension views.  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  No fracture or dislocation seen.     Patient name: Vanessa [name] Patient MRN: [id number] Date of visit: 12/04/23

## 2023-12-11 ENCOUNTER — Encounter: Payer: Self-pay | Admitting: Radiology

## 2023-12-16 NOTE — Progress Notes (Unsigned)
 Vanessa [name] - 73 y.o. female MRN [id number]  Date of birth: July 11, 1950  Office Visit Note: Visit Date: 12/18/2023 PCP: Sari Bar Referred by: Sari Bar  Subjective: No chief complaint on file.  HPI: Vanessa [name] is a pleasant 73 y.o. female who returns today for specific hand surgical evaluation for ongoing right sided carpal tunnel syndrome.  Symptoms have been present now for multiple months.  She was seen initially by Herlene Calix, PA who performed cortisone injection to the right carpal tunnel.  She was also fitted for a wrist brace which she is using occasionally.  States that her symptoms have persisted despite injection, does have some persistent numbness in the long finger.  There are associated nocturnal symptoms as well.  Pertinent ROS were reviewed with the patient and found to be negative unless otherwise specified above in HPI.    Assessment & Plan: Visit Diagnoses:  1. Carpal tunnel syndrome, right upper limb      Plan: Extensive discussion was had with the patient today about her ongoing right sided carpal tunnel syndrome that is refractory to conservative care.  Patient has both clinical evidence to confirm this diagnosis with a carpal tunnel injection with interval success.    However, since some symptoms have persisted/recurred, at this juncture, she is indicated for right open versus endoscopic carpal tunnel release.  Risks and benefits of both operations were discussed in detail today.  Understanding all risks and benefits, patient would like to have surgery done in the form of right open carpal tunnel release under local anesthesia in the office setting.  Risks include but not limited to infection, bleeding, scarring, stiffness, nerve injury or vascular, tendon injury, risk of recurrence and need for subsequent operation were all discussed in detail.  Patient consented understanding the above.      Follow-up: No follow-ups on  file.   Meds & Orders: No orders of the defined types were placed in this encounter.  No orders of the defined types were placed in this encounter.    Procedures: No procedures performed      Clinical History: MRI LUMBAR SPINE WITHOUT CONTRAST   TECHNIQUE: Multiplanar, multisequence MR imaging of the lumbar spine was performed. No intravenous contrast was administered.   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 The last full/open intervertebral disc space is labeled L5-S1 and there is a small disc space at S1-2.   Alignment:  Degenerative anterolisthesis of L5.   Vertebrae:  Normal marrow signal.  No bone lesions or fractures.   Conus medullaris and cauda equina: Conus extends to the L1 level. Conus and cauda equina appear normal.   Paraspinal and other soft tissues: No significant paraspinal or retroperitoneal findings.   Disc levels:   T12-L1: No significant findings. Small right-sided perineural cyst or dilated nerve root sheath.   L1-2: Mild facet disease and ligamentum flavum thickening but no significant spinal or foraminal stenosis.   L2-3: Mild annular bulge and moderate facet disease contributing to mild bilateral lateral recess encroachment. No spinal or foraminal stenosis.   L3-4: Bulging annulus, facet disease and ligamentum flavum thickening contributing to early spinal stenosis and mild bilateral lateral recess stenosis. No foraminal stenosis.   L4-5: Bulging uncovered disc, short pedicles, advanced facet disease and ligamentum flavum thickening all contributing to severe spinal and bilateral lateral recess stenosis. No significant foraminal stenosis.   L5-S1: Degenerative anterolisthesis of L5 due to  severe facet disease. There is a bulging uncovered disc in conjunction with advanced facet disease and ligamentum flavum thickening contributing to  severe spinal and bilateral lateral recess stenosis. No foraminal stenosis.   IMPRESSION: 1. 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 The last full/open intervertebral disc space is labeled L5-S1 and there is a small disc space at S1-2. 2. Mild bilateral lateral recess encroachment at L2-3. 3. Early spinal stenosis and mild bilateral lateral recess stenosis at L3-4. 4. Severe multifactorial spinal and bilateral lateral recess stenosis at L4-5. 5. Severe multifactorial spinal and bilateral lateral recess stenosis at L5-S1.     Electronically Signed   By: MYRTIS Stammer M.D.   On: 05/20/2023 14:47  She reports that she has quit smoking. Her smoking use included cigarettes. She has never used smokeless tobacco. No results for input(s): HGBA1C, LABURIC in the last 8760 hours.  Objective:   Vital Signs: There were no vitals taken for this visit.  Physical Exam  Gen: Well-appearing, in no acute distress; non-toxic CV: Regular Rate. Well-perfused. Warm.  Resp: Breathing unlabored on room air; no wheezing. Psych: Fluid speech in conversation; appropriate affect; normal thought process  Ortho Exam PHYSICAL EXAM:  General: Patient is well appearing and in no distress.   Skin and Muscle: No significant skin changes are apparent to upper extremities.   Range of Motion and Palpation Tests: Mobility is full about the elbows with flexion and extension. Forearm supination and pronation are 85/85 bilaterally.  Wrist flexion/extension is 75/65 bilaterally.  Digital flexion and extension are full.  Thumb opposition is full to the base of the small fingers bilaterally.    No cords or nodules are palpated.  No triggering is observed.    Neurologic, Vascular, Motor: Sensation is diminished to light touch in the right long finger   Thenar atrophy: Negative Tinel sign: Positive right carpal tunnel Carpal tunnel compression: Positive right Phalen test: Positive  right  Motor right hand FPL: 5/5 Index FDP: 5/5 APB: 5/5  Fingers pink and well perfused.  Capillary refill is brisk.     No results found for: HGBA1C   Imaging: No results found.  Past Medical/Family/Surgical/Social History: Medications & Allergies reviewed per EMR, new medications updated. Patient Active Problem List   Diagnosis Date Noted   Lumbar stenosis with neurogenic claudication 09/08/2023   Degenerative spondylolisthesis 04/25/2023   Neural foraminal stenosis of lumbar spine 04/25/2023   Complete tear of right rotator cuff    Biceps tendonitis on right    Degenerative superior labral anterior-to-posterior (SLAP) tear of right shoulder    Unilateral primary osteoarthritis, right knee 05/13/2020   Past Medical History:  Diagnosis Date   Aortic valve sclerosis    Arthritis    osteoarthritis   Back pain    Cancer (HCC)    endometrial   Chronic kidney disease    stage 3   Depression    GERD (gastroesophageal reflux disease)    Heart murmur    AV sclerosis; 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 but no obvious stenosis or regurgitation 12/04/17 echo ([facility])   Hepatitis    History of kidney stones    Hypertension    Hypothyroidism    PONV (postoperative nausea and vomiting)    Family History  Problem Relation Age of Onset   Colon cancer Neg Hx    Rectal cancer Neg Hx    Stomach cancer Neg Hx    Past Surgical History:  Procedure Laterality Date   ABDOMINAL HYSTERECTOMY  2008   BREAST SURGERY Right 06/20/2011   lumpectomy for atypical ductal hyperplasia   DECOMPRESSIVE LUMBAR LAMINECTOMY LEVEL 1 N/A 09/08/2023   Procedure: DECOMPRESSIVE LUMBAR LAMINECTOMY LEVEL 1;  Surgeon: Georgina Ozell LABOR, MD;  Location: [facility] OR;  Service: Orthopedics;  Laterality: N/A;  L4-5 LAMINECTOMY   SHOULDER ARTHROSCOPY WITH ROTATOR CUFF REPAIR AND SUBACROMIAL DECOMPRESSION Right 01/29/2021   Procedure: RIGHT SHOULDER ARTHROSCOPY, DEBRIDEMENT, WITH MINI OPEN ROTATOR CUFF TEAR REPAIR  & BICEPS TENODESIS;  Surgeon: Addie Cordella Hamilton, MD;  Location: [facility] OR;  Service: Orthopedics;  Laterality: Right;   TONSILLECTOMY     TUBAL LIGATION     Social History   Occupational History   Not on file  Tobacco Use   Smoking status: Former    Types: Cigarettes   Smokeless tobacco: Never  Vaping Use   Vaping status: Never Used  Substance and Sexual Activity   Alcohol use: Yes    Comment: 1-2 drinks/wk when not taking Tramadol   Drug use: Never   Sexual activity: Not on file    Taner Rzepka Afton Alderton, M.D. [facility], Hand Surgery

## 2023-12-18 ENCOUNTER — Ambulatory Visit: Admitting: Orthopedic Surgery

## 2023-12-18 DIAGNOSIS — G5601 Carpal tunnel syndrome, right upper limb: Secondary | ICD-10-CM | POA: Diagnosis not present

## 2023-12-19 ENCOUNTER — Ambulatory Visit
Admission: RE | Admit: 2023-12-19 | Discharge: 2023-12-19 | Disposition: A | Discharge status: Home / Self Care | Source: Ambulatory Visit | Attending: Obstetrics and Gynecology | Admitting: Obstetrics and Gynecology

## 2023-12-19 ENCOUNTER — Ambulatory Visit
Admission: RE | Admit: 2023-12-19 | Discharge: 2023-12-19 | Disposition: A | Source: Ambulatory Visit | Attending: Obstetrics and Gynecology | Admitting: Obstetrics and Gynecology

## 2023-12-19 DIAGNOSIS — R928 Other abnormal and inconclusive findings on diagnostic imaging of breast: Secondary | ICD-10-CM

## 2023-12-22 ENCOUNTER — Encounter: Payer: Self-pay | Admitting: *Deleted

## 2023-12-22 DIAGNOSIS — Z006 Encounter for examination for normal comparison and control in clinical research program: Secondary | ICD-10-CM

## 2023-12-22 NOTE — Research (Signed)
 Message left for Vanessa [name] about pre-event research study. Encouraged her to call with any questions, or if interested.

## 2023-12-22 NOTE — Research (Unsigned)
 Ms Galluzzo called states she would like more information. Emailed her a copy of the consents to read over. Encouraged her to call or email me with any questions.

## 2023-12-25 ENCOUNTER — Encounter: Payer: Self-pay | Admitting: *Deleted

## 2023-12-25 NOTE — Research (Unsigned)
 Email from Ms Stonewall scheduled her to come in for screening on Dec 8 at 1000.

## 2024-01-01 ENCOUNTER — Other Ambulatory Visit: Payer: Self-pay

## 2024-01-01 DIAGNOSIS — G5601 Carpal tunnel syndrome, right upper limb: Secondary | ICD-10-CM

## 2024-01-10 ENCOUNTER — Encounter (HOSPITAL_COMMUNITY): Payer: Self-pay

## 2024-01-10 ENCOUNTER — Ambulatory Visit (HOSPITAL_COMMUNITY)
Admission: RE | Admit: 2024-01-10 | Discharge: 2024-01-10 | Disposition: A | Discharge status: Home / Self Care | Source: Ambulatory Visit

## 2024-01-10 ENCOUNTER — Ambulatory Visit (INDEPENDENT_AMBULATORY_CARE_PROVIDER_SITE_OTHER)

## 2024-01-10 VITALS — BP 122/73 | HR 78 | Temp 98.9°F | Resp 18

## 2024-01-10 DIAGNOSIS — G9331 Postviral fatigue syndrome: Secondary | ICD-10-CM | POA: Diagnosis not present

## 2024-01-10 LAB — POC COVID19/FLU A&B COMBO
Covid Antigen, POC: NEGATIVE
Influenza A Antigen, POC: NEGATIVE
Influenza B Antigen, POC: NEGATIVE

## 2024-01-10 MED ORDER — AZELASTINE HCL 0.1 % NA SOLN: 1.0000 | Freq: Two times a day (BID) | NASAL | 0 refills | Status: DC

## 2024-01-10 NOTE — ED Provider Notes (Signed)
 [facility]  Note:  This document was prepared using Conservation officer, historic buildings and may include unintentional dictation errors.  MRN: [id number] DOB: September 20, 1950  Subjective:   Vanessa [name] is a 73 y.o. female presenting for cough, mucus production, congestion, night sweats, fatigue for 3 to 4 weeks.  Patient reports initially she became sick on November 5 after returning from a trip to [place].  Patient states that she had night sweats, congestion, productive cough and headache off and on for 3 weeks.  Patient reports that last week during Thanksgiving symptoms had subsided and she was feeling much better.  Patient states over the last 2 to 3 days her symptoms have returned and has been taking antihistamines, cough suppression medication with minimal improvement.  Patient denies any known exposure anyone with COVID or flu.  No shortness of breath, chest pain, weakness, dizziness.  No current facility-administered medications for this encounter.  Current Outpatient Medications:    azelastine (ASTELIN) 0.1 % nasal spray, Place 1 spray into both nostrils 2 (two) times daily. Use in each nostril as directed, Disp: 30 mL, Rfl: 0   atorvastatin  (LIPITOR) 80 MG tablet, Take 1 tablet (80 mg total) by mouth daily., Disp: 90 tablet, Rfl: 1   [employer] ASPIRIN  ADULT LOW DOSE 81 MG chewable tablet, CHEW 1 TABLET BY MOUTH DAILY., Disp: 36 tablet, Rfl: 0   Cyanocobalamin (B-12 PO), Take 1 capsule by mouth 3 (three) times a week., Disp: , Rfl:    ezetimibe  (ZETIA ) 10 MG tablet, Take 1 tablet (10 mg total) by mouth daily., Disp: 90 tablet, Rfl: 3   famotidine  (PEPCID ) 20 MG tablet, Take 20 mg by mouth 2 (two) times daily., Disp: , Rfl:    furosemide  (LASIX ) 20 MG tablet, Take 20 mg by mouth daily., Disp: , Rfl:    hydrALAZINE  (APRESOLINE ) 100 MG tablet, Take 100 mg by mouth 3 (three) times daily., Disp: , Rfl:    levothyroxine  (SYNTHROID ) 150 MCG tablet, Take 150 mcg by mouth daily  before breakfast., Disp: , Rfl:    loratadine  (CLARITIN ) 10 MG tablet, Take 10 mg by mouth daily as needed for allergies., Disp: , Rfl:    metoprolol  succinate (TOPROL  XL) 25 MG 24 hr tablet, Take 1 tablet (25 mg total) by mouth daily., Disp: 90 tablet, Rfl: 1   mirtazapine (REMERON) 15 MG tablet, Take 15 mg by mouth at bedtime., Disp: , Rfl:    Multiple Vitamins-Minerals (HAIR SKIN AND NAILS FORMULA) TABS, Take 1-2 tablets by mouth See admin instructions. Take 1 tablet on days when taking the b12 and 2 tablets on the non-b12 days, Disp: , Rfl:    NIFEdipine  (PROCARDIA  XL/NIFEDICAL XL) 60 MG 24 hr tablet, Take 60 mg by mouth 2 (two) times daily., Disp: , Rfl:    sodium bicarbonate  650 MG tablet, Take 650 mg by mouth 2 (two) times daily., Disp: , Rfl:    venlafaxine  XR (EFFEXOR -XR) 75 MG 24 hr capsule, Take 75 mg by mouth daily with breakfast., Disp: , Rfl:    VITAMIN D PO, Take 1 capsule by mouth 3 (three) times a week., Disp: , Rfl:    Allergies  Allergen Reactions   Lisinopril Cough    Other Reaction(s): cough   Morphine     GI Upset (intolerance)  Other Reaction(s): GI Intolerance, vomiting   Codeine Other (See Comments)    Upset stomach    Past Medical History:  Diagnosis Date   Aortic valve sclerosis    Arthritis  osteoarthritis   Back pain    Cancer (HCC)    endometrial   Chronic kidney disease    stage 3   Depression    GERD (gastroesophageal reflux disease)    Heart murmur    AV sclerosis; 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 but no obvious stenosis or regurgitation 12/04/17 echo ([facility])   Hepatitis    History of kidney stones    Hypertension    Hypothyroidism    PONV (postoperative nausea and vomiting)      Past Surgical History:  Procedure Laterality Date   ABDOMINAL HYSTERECTOMY  2008   BREAST SURGERY Right 06/20/2011   lumpectomy for atypical ductal hyperplasia   DECOMPRESSIVE LUMBAR LAMINECTOMY LEVEL 1 N/A 09/08/2023   Procedure: DECOMPRESSIVE LUMBAR  LAMINECTOMY LEVEL 1;  Surgeon: Georgina Ozell LABOR, MD;  Location: [facility] OR;  Service: Orthopedics;  Laterality: N/A;  L4-5 LAMINECTOMY   SHOULDER ARTHROSCOPY WITH ROTATOR CUFF REPAIR AND SUBACROMIAL DECOMPRESSION Right 01/29/2021   Procedure: RIGHT SHOULDER ARTHROSCOPY, DEBRIDEMENT, WITH MINI OPEN ROTATOR CUFF TEAR REPAIR & BICEPS TENODESIS;  Surgeon: Addie Cordella Hamilton, MD;  Location: [facility] OR;  Service: Orthopedics;  Laterality: Right;   TONSILLECTOMY     TUBAL LIGATION      Family History  Problem Relation Age of Onset   Colon cancer Neg Hx    Rectal cancer Neg Hx    Stomach cancer Neg Hx     Social History   Tobacco Use   Smoking status: Former    Types: Cigarettes   Smokeless tobacco: Never  Vaping Use   Vaping status: Never Used  Substance Use Topics   Alcohol use: Yes    Comment: 1-2 drinks/wk when not taking Tramadol   Drug use: Never    ROS Refer to HPI for ROS details.  Objective:    Vitals: BP 122/73 (BP Location: Right Arm)   Pulse 78   Temp 98.9 F (37.2 C) (Oral)   Resp 18   SpO2 97%   Physical Exam Vitals and nursing note reviewed.  Constitutional:      General: She is not in acute distress.    Appearance: Normal appearance. She is well-developed. She is not ill-appearing or toxic-appearing.  HENT:     Head: Normocephalic and atraumatic.     Nose: Congestion and rhinorrhea present.     Mouth/Throat:     Mouth: Mucous membranes are moist.  Cardiovascular:     Rate and Rhythm: Normal rate.  Pulmonary:     Effort: Pulmonary effort is normal. No respiratory distress.     Breath sounds: No stridor. No wheezing.  Chest:     Chest wall: No tenderness.  Skin:    General: Skin is warm and dry.  Neurological:     General: No focal deficit present.     Mental Status: She is alert and oriented to person, place, and time.  Psychiatric:        Mood and Affect: Mood normal.        Behavior: Behavior normal.     Procedures  Results for orders placed or  performed during the hospital encounter of 01/10/24 (from the past 24 hours)  POC Covid19/Flu A&B Antigen     Status: None   Collection Time: 01/10/24  5:41 PM  Result Value Ref Range   Influenza A Antigen, POC Negative Negative   Influenza B Antigen, POC Negative Negative   Covid Antigen, POC Negative Negative    Assessment and Plan :  Discharge Instructions       1. Postviral syndrome (Primary) - POC Covid19/Flu A&B Antigen complete in UC is negative for COVID and influenza - DG Chest 2 View x-ray completed in UC shows no acute cardiopulmonary processes, no sign of consolidation or pneumonia, normal chest x-ray. - azelastine (ASTELIN) 0.1 % nasal spray; Place 1 spray into both nostrils 2 (two) times daily. Use in each nostril as directed  Dispense: 30 mL; Refill: 0 - Continue using over-the-counter medications to control viral symptoms  -Continue to monitor symptoms for any change in severity if there is any escalation of current symptoms or development of new symptoms follow-up in ER for further evaluation and management.      Shanele Nissan B Marsela Kuan   Jeanette Rauth, [place], [place] 01/10/24 [phone]

## 2024-01-10 NOTE — ED Triage Notes (Addendum)
 Pt returned from [place] 11/3 then on November 5th started having cough-productive with greenish, night sweats, congestion. Reports was good at Thanksgiving but came back 2-3 days ago. Taking antihistamines and cough medications.

## 2024-01-10 NOTE — Discharge Instructions (Signed)
  1. Postviral syndrome (Primary) - POC Covid19/Flu A&B Antigen complete in UC is negative for COVID and influenza - DG Chest 2 View x-ray completed in UC shows no acute cardiopulmonary processes, no sign of consolidation or pneumonia, normal chest x-ray. - azelastine (ASTELIN) 0.1 % nasal spray; Place 1 spray into both nostrils 2 (two) times daily. Use in each nostril as directed  Dispense: 30 mL; Refill: 0 - Continue using over-the-counter medications to control viral symptoms  -Continue to monitor symptoms for any change in severity if there is any escalation of current symptoms or development of new symptoms follow-up in ER for further evaluation and management.

## 2024-01-11 ENCOUNTER — Ambulatory Visit (HOSPITAL_COMMUNITY): Payer: Self-pay

## 2024-01-12 ENCOUNTER — Encounter: Payer: Self-pay | Admitting: Orthopedic Surgery

## 2024-01-15 ENCOUNTER — Encounter: Admitting: *Deleted

## 2024-01-15 DIAGNOSIS — Z006 Encounter for examination for normal comparison and control in clinical research program: Secondary | ICD-10-CM

## 2024-01-15 NOTE — Research (Addendum)
 Lipids:  LPa 412.0              [x] Clinically Significant  [] Not Clinically Significant I will bring in for main screening for pre-event   Any further action needed to be taken per the PI? Yes, bring in for screening  Vinie KYM Maxcy, MD, [facility], FNLA, FACP    [facility]  Medical Director of the [facility] Lipid Disorders &  Cardiovascular Risk Reduction Clinic Diplomate of the American Board of Clinical Lipidology Attending Cardiologist  Direct Dial: [phone]  Fax: [phone]  Website:  [email or web address]     Pre-event  INCLUSION  Yes No  Participant has provided written informed consent before initiation of any study-specific activities/procedures. [x]  []   Age >= 50 years at the time of signing of the Lp(a) screening ICF. [x]  []   Lp(a) >= 200 nmol/L during Lp(a) screening by a central laboratory using an investigational IVD test. At least 4 weeks of stable and optimized lipid-lowering therapy consistent with regional/local clinical practice guidelines or according to investigators judgment before Lp(a) screening.                                                                                                         _______________ Waiting on results    Participants meeting at least one of the following categories (A or B):  A. Multiple risk factors for atherosclerotic disease  1. Presence of >= 4 of any of the following ASCVD risk factors: a. Age >= 65 years men or women b. History of hypertension requiring pharmacotherapy c. Current cigarette tobacco smoking d. Diabetes mellitus (Type 1 or Type 2) requiring pharmacotherapy e. Screening high-sensitivity C-reactive protein (hs-CRP) >= 2.0 mg/L by central laboratory f. Screening estimated glomerular filtration rate (eGFR) 30 to < 60 mL/min/1.73 m2 by central laboratory g. Familial hypercholesterolemia  or family history of premature ASCVD (1st degree relative before age 58 years for males,  and/or 1st degree relative before age 20 years for females)  AND/OR  B. History of atherosclerosis evidenced by:  1. Coronary Artery Disease-Reporting and Data System (CAD-RADS) P3, and/or 2. Coronary artery calcium  (CAC) > 300, and/or                          CAC 222 3. Atherosclerosis defined by at least one of the following AND >= 2 additional risk factors listed in 104 A: a. Coronary atherosclerosis as evidenced by a stenosis >= 50% in at least one artery b. Coronary atherosclerosis as evidenced by a stenosis >= 25% (or reported as at least mild) in at least 2 arteries detected on invasive or noninvasive imaging c. Coronary atherosclerosis as evidenced by a CAC score > 100 and/or CAD-RADS >= P2 d. Carotid atherosclerosis as evidenced by an internal carotid stenosis >= 50% e. Peripheral atherosclerosis as evidenced by >= 50% stenosis of limb artery and/or ankle brachial index < 0.9  Bold the above patient meets.  [x]  []   EXCLUSION Yes No  Prior acute atherothrombotic qualifying event at any time, defined as prior myocardial infarction, prior stroke, prior transient ischemic attack, or prior acute limb ischemia. []  [x]   Prior arterial revascularization at any time suspected to be associated with atherosclerosis. []  [x]   If available, participants without known atherosclerosis, CAC score = 0, CAD-RADS = 0, in the last 10 years prior to enrollment. []  [x]   Severe renal dysfunction, defined as an eGFR < 30 mL/min/1.73 m2 by central laboratory during screening []  [x]   History of decompensated liver cirrhosis and/or screening aspartate aminotransferase (AST) or alanine aminotransferase (ALT) > 3 x upper limit of normal (ULN), or total bilirubin (TBL) > 2 x ULN (except in stable Gilberts syndrome). []  [x]   History of major bleeding disorder (eg, hemophilia, von Willebrand disease, clotting factor deficiencies, etc).  []  [x]   Planned arterial revascularization (percutaneous or surgical). []  [x]   Fasting triglycerides > 400 mg/dL (5.47 mmol/L) during screening. []  [x]   Participant has known sensitivity to any of the products or components to be administered during dosing or the study procedures. []  [x]   Malignancy not in remission for at least 5 years prior to enrollment, exceptions for less than 5 years since remission include non-melanoma skin cancers, localized thyroid cancer (papillary, follicular, and medullary), cervical in situ carcinoma, breast ductal carcinoma in situ, or stage 1 prostate carcinoma. []  [x]   Diagnosis of severe heart failure (New York  Heart Association Functional Classification IV), and/or if available, most recent left ventricular ejection fraction < 30%. []  [x]   Uncontrolled or recurrent ventricular tachycardia in the past 3 months before enrollment. []  [x]   Atrial fibrillation or flutter and not on an anticoagulant despite an indication to be anticoagulated.    Uncontrolled hypertension at screening, defined as systolic blood pressure > 180 mmHg or diastolic blood pressure > 110 mmHg at rest despite antihypertensive therapy. []  [x]   Diabetes mellitus (Type 1 or Type 2) with a hemoglobin A1C (HbA1c) >= 10% by central laboratory at screening. []  [x]   History or evidence of any other clinically significant disorder, condition, or disease (such as active infection) that, in the opinion of the investigator or [employer] physician, if consulted, would pose a risk to participant safety, or interfere with the study evaluation, procedures or completion. []  [x]   Current or planned lipoprotein apheresis or < 3 months since last apheresis treatment before enrollment. []  [x]   Participant has taken a cholesteryl ester transfer protein inhibitor or lomitapide in the last 12 months before enrollment. []  [x]   Previously received any therapy specifically targeting Lp(a) including but not limited to,  olpasiran, pelacarsen, lepodisiran, zerlasiran, or muvalaplin []  [x]    Currently receiving treatment in another investigational device or drug study, or less than 30 days since ending treatment on another investigational device or drug study(ies). Other investigational procedures while participating in this study are excluded. []  [x]   Participants of childbearing potential unwilling to use protocol-specified method of contraception during treatment and for an additional 30 days after the last dose of investigational product.  []  [x]   Participants who are breastfeeding or who plan to breastfeed while on study through 30 days after the last dose of investigational product []  [x]   Participants planning to become pregnant while on study through 30 days after the last dose of investigational product.  []  [x]   Participants of childbearing potential with a positive pregnancy test assessed at screening and/or day 1 by a highly sensitive urine or serum  pregnancy test. []  [x]   Participant likely to not be available to complete all protocol-required study visits or procedures, and/or to comply with all required study procedures (eg, Clinical Outcome Assessments) to the best of the Participant and investigators knowledge.                         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  The informed consent form, study requirements and expectations were reviewed with the subject and questions and concerns were addressed  prior to the signing of the consent form.  The subject verbalized understanding of the trial requirements.  The subject agreed to participate in the pre-event LPa Screening   trial and signed the informed consent at 0945 on 01-15-2024.  The informed consent was obtained prior to performance of any protocol-specific procedures for the subject.  A copy of the signed informed consent was given to the subject and a copy was placed in the subject's medical record.   Jaden Abreu Ward    Ms Bry is here for LPa screening of pre-event. She was given time to ask questions related to the consent and study. . She states she read it at home, and has no questions.  Informed her I will call her later with lab results.  Voices understanding. Blood drawn at 0956.   Current Outpatient Medications:    atorvastatin  (LIPITOR) 80 MG tablet, Take 1 tablet (80 mg total) by mouth daily., Disp: 90 tablet, Rfl: 1   azelastine  (ASTELIN ) 0.1 % nasal spray, Place 1 spray into both nostrils 2 (two) times daily. Use in each nostril as directed, Disp: 30 mL, Rfl: 0   [employer] ASPIRIN  ADULT LOW DOSE 81 MG chewable tablet, CHEW 1 TABLET BY MOUTH DAILY., Disp: 36 tablet, Rfl: 0   Cyanocobalamin (B-12 PO), Take 1 capsule by mouth 3 (three) times a week., Disp: , Rfl:    ezetimibe  (ZETIA ) 10 MG tablet, Take 1 tablet (10 mg total) by mouth daily., Disp: 90 tablet, Rfl: 3   famotidine  (PEPCID ) 20 MG tablet, Take 20 mg by mouth 2 (two) times daily., Disp: , Rfl:    furosemide  (LASIX ) 20 MG tablet, Take 20 mg by mouth daily., Disp: , Rfl:    hydrALAZINE  (APRESOLINE ) 100 MG tablet, Take 100 mg by mouth 3 (three) times daily., Disp: , Rfl:    levothyroxine  (SYNTHROID ) 150 MCG tablet, Take 150 mcg by mouth daily before breakfast., Disp: , Rfl:    loratadine  (CLARITIN ) 10 MG tablet, Take 10 mg by mouth daily as needed for allergies., Disp: , Rfl:    metoprolol  succinate (TOPROL  XL) 25 MG 24 hr tablet, Take 1 tablet (25 mg total) by mouth  daily., Disp: 90 tablet, Rfl: 1   mirtazapine (REMERON) 15 MG tablet, Take 15 mg by mouth at bedtime., Disp: , Rfl:    Multiple Vitamins-Minerals (HAIR SKIN AND NAILS FORMULA) TABS,  Take 1-2 tablets by mouth See admin instructions. Take 1 tablet on days when taking the b12 and 2 tablets on the non-b12 days, Disp: , Rfl:    NIFEdipine  (PROCARDIA  XL/NIFEDICAL XL) 60 MG 24 hr tablet, Take 60 mg by mouth 2 (two) times daily., Disp: , Rfl:    sodium bicarbonate  650 MG tablet, Take 650 mg by mouth 2 (two) times daily., Disp: , Rfl:    venlafaxine  XR (EFFEXOR -XR) 75 MG 24 hr capsule, Take 75 mg by mouth daily with breakfast., Disp: , Rfl:    VITAMIN D PO, Take 1 capsule by mouth 3 (three) times a week., Disp: , Rfl:

## 2024-01-17 ENCOUNTER — Other Ambulatory Visit: Payer: Self-pay | Admitting: Orthopedic Surgery

## 2024-01-17 MED ORDER — OXYCODONE HCL 5 MG PO TABS: 5.0000 mg | ORAL_TABLET | Freq: Four times a day (QID) | ORAL | 0 refills | Status: DC | PRN

## 2024-01-18 DIAGNOSIS — G5601 Carpal tunnel syndrome, right upper limb: Secondary | ICD-10-CM

## 2024-01-22 ENCOUNTER — Encounter: Payer: Self-pay | Admitting: *Deleted

## 2024-01-22 DIAGNOSIS — Z006 Encounter for examination for normal comparison and control in clinical research program: Secondary | ICD-10-CM

## 2024-01-22 NOTE — Research (Signed)
 Message left for [name]. Her LPa was 412 so we can bring her in for main screening visit of pre-event. Encouraged her to call me back so we can schedule a time for her to come in for main screening.

## 2024-01-22 NOTE — Therapy (Signed)
 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 but no obvious stenosis or regurgitation 12/04/17 echo ([facility])   Hepatitis    History of kidney stones    Hypertension    Hypothyroidism    PONV (postoperative nausea and vomiting)    Past Surgical History:  Procedure Laterality Date   ABDOMINAL HYSTERECTOMY  2008   BREAST SURGERY Right 06/20/2011   lumpectomy for atypical ductal hyperplasia   DECOMPRESSIVE LUMBAR LAMINECTOMY LEVEL 1 N/A 09/08/2023   Procedure: DECOMPRESSIVE LUMBAR LAMINECTOMY LEVEL 1;  Surgeon: Georgina Ozell LABOR, MD;  Location: [facility] OR;  Service: Orthopedics;  Laterality: N/A;  L4-5 LAMINECTOMY   SHOULDER ARTHROSCOPY WITH ROTATOR CUFF REPAIR AND SUBACROMIAL DECOMPRESSION Right 01/29/2021   Procedure: RIGHT SHOULDER ARTHROSCOPY, DEBRIDEMENT, WITH MINI OPEN ROTATOR CUFF TEAR REPAIR & BICEPS TENODESIS;  Surgeon: Addie Cordella Hamilton, MD;  Location: [facility] OR;  Service: Orthopedics;  Laterality: Right;   TONSILLECTOMY     TUBAL LIGATION     Patient Active Problem List   Diagnosis Date Noted   Lumbar stenosis with neurogenic claudication 09/08/2023   Degenerative spondylolisthesis 04/25/2023   Neural foraminal stenosis of lumbar spine 04/25/2023   Complete tear of right rotator cuff    Biceps tendonitis on right    Degenerative superior labral anterior-to-posterior (SLAP) tear of right shoulder    Unilateral primary osteoarthritis, right knee 05/13/2020     ONSET DATE:  DOS  01/18/24  REFERRING DIAG: G56.01 (ICD-10-CM) - Carpal tunnel syndrome, right upper limb   THERAPY DIAG:     Localized edema  Muscle weakness (generalized)  Stiffness of right wrist, not elsewhere classified  Rationale for Evaluation and Treatment: Rehabilitation  SUBJECTIVE:   SUBJECTIVE STATEMENT: The patient states hx of paresthesia and pain in their hand and subsequent Endo surgical release of the carpal tunnel. Now the patient states having some lingering paresthesia, stiffness, pain, decreased ability to make a fist and perform I/ADLs.     PERTINENT HISTORY: The patient is now approx 5-6 days s/p Rt hand CTR.   PRECAUTIONS: None relative to this evaluation and episode of care.   RED FLAGS: None   WEIGHT BEARING RESTRICTIONS: Yes: caution with weightbearing for the next 4-6 weeks, recommended less than 5lbs for next 3-4 weeks with affected hand  PAIN:  Are you having pain? Yes: NPRS scale: mild now at rest  Pain location:  sx area Pain description: aching and sore Aggravating factors: gripping/squeezing Relieving factors: rest  FALLS: Has patient fallen in last 6 months? No, not a fall risk  PLOF: Independent with I/ADLs  PATIENT GOALS: To improve motion, function with affected surgical hand  NEXT MD VISIT: PRN    OBJECTIVE MEASURES:   ADLs: Overall ADLs: States decreased ability to grab, hold household objects, pain and difficulty to open containers, perform FMS tasks (manipulate fasteners on clothing).     UPPER EXTREMITY ROM:     A/ROM Right eval  Wrist flexion ~55  Wrist extension ~50  (Blank rows = not tested)                    Hand A/ROM Right eval  Full Fist Ability (or Gap to Distal Palmar Crease) Full fist, a bit sore  Thumb Opposition  (Kapandji Scale)  8/10  (Blank rows = not tested)   HAND STRENGTH & FUNCTION: Eval: Observed weakness in affected hand/arm, grossly 3-/5 MMT, but specific gripping and resistance training  contraindicated today. Also at least mild observed coordination impairments with affected hand/arm due to stiffness and soreness. These deficits are expected to improve with HEP and recommendations.    COORDINATION: Eval: Mild observed coordination impairments with surgical hand, as seen by pain,stiffness, etc. Expected to improve with HEP and recommendations.   SENSATION: Eval:  Light touch mildly diminished especially through sx area. Expected to improve with HEP and recommendations.   EDEMA:   Eval:  Mildly swollen in surgical hand today.  Expected to improve with HEP and recommendations.   COGNITION: Eval: Overall cognitive status: WFL for evaluation today   OBSERVATIONS:   Eval: Surgical site is clean and no overt signs of infection, no drainage, signs of dehiscence, etc.  Tenderness and swelling is within normal limits for post-op timeframe.     TODAY'S TREATMENT:  Post-evaluation treatment:   The patient was given safety information for managing post-op wound, including not to soak wound, to keep clean and dry, to start with gentle light touch for desensitization and eventually more aggressive scar mobilizations approx 5-7 days after stitches are removed and if the wound is closed. The patient should monitor for signs of infection.  The patient was supplied with compressive gauze to help with swelling as needed.  The patient should contact the surgeon with any concerns immediately.   The patient should also avoid any strong gripping, push, pull, weight bearing or repetitive motion for the next 4-6 weeks.  The patient should not be doing painful activities.  The patient should not rest on their palm, keep the wrist bent for long time periods, or sleep on their hand. After a month, the patient can progressively return to all light, normal activities. Sports and heavy weight lifting should be withheld for a total of 3 months.   The patient was also educated (explanation and  demonstration) on the following home exercise program including tolerable range of motion, gentle passive range of motion, scar care, progressive desensitization, prevention of soft tissue contractures, etc. The patient states understanding all directions and feels comfortable with doing this at home, self-management, and following up with the surgeon as needed/scheduled.    CTR Exercises  - Turn [employer] Facing Up & Down  - 4 x daily - 15 reps - Bend and Pull Back Wrist SLOWLY  - 4 x daily - 15 reps - Tendon Glides  - 4 x daily - 5 reps - 3 second hold - Median Nerve Flossing  - 3 x daily - 5 reps - Wrist Prayer Stretch  - 4 x daily - 3 reps - 15 second hold - Full finger stretches   - 4 x daily - 3 reps - 15 second hold Patient Education - Scar Massage   PATIENT EDUCATION: Education details: See tx section above for details  Person educated: Patient Education method: Verbal Instruction, Teach back, Handouts  Education comprehension: States and demonstrates understanding   HOME EXERCISE PROGRAM: See tx section above for details    GOALS: Goals reviewed with patient?  Yes   SHORT TERM GOALS: (STG required if POC>30 days) Target Date: 01/23/24  1.  Pt will demo/state understanding of initial HEP and therapist recommendations to improve pain levels, improve motions and ability and eventually return to normal activities.   Goal status: MET    ASSESSMENT:  CLINICAL IMPRESSION: Patient is a 73 y.o. female who was seen today for occupational therapy evaluation for swelling, pain, weakness and decreased functional ability following carpal tunnel release procedure. The patient is appropriate for OT rehab services and benefited from treatment today. The patient got copious education/treatment today for self-care, wound management, exercises and how to transition to normal activities in the next 4-6 weeks. The patient agrees that they can manage these recommendations independently, and  should not need to return for follow up visits. The patient should follow up with the surgeon with any concerns, and could possibly return to therapy, if needed, with a new order.  The patient will discharge therapy treatment after this visit.     PERFORMANCE DEFICITS: in functional skills including ADLs, IADLs, coordination, dexterity, sensation, edema, ROM, strength, pain, fascial restrictions, flexibility, Fine motor control, body mechanics, endurance, decreased knowledge of precautions, wound, and UE functional use, cognitive skills including problem solving and safety awareness, and psychosocial skills including coping strategies, environmental adaptation, and habits.   IMPAIRMENTS: are limiting patient from ADLs, IADLs, rest and sleep, leisure, and social participation.   COMORBIDITIES: may have co-morbidities  that affects occupational performance. Patient will benefit from skilled OT to address above impairments and improve overall function.  MODIFICATION OR ASSISTANCE TO COMPLETE EVALUATION: No modification of tasks or assist necessary to complete an evaluation.  OT OCCUPATIONAL PROFILE AND HISTORY: Problem focused assessment: Including review of records relating to presenting problem.  CLINICAL DECISION MAKING: LOW - limited treatment options, no task modification necessary  REHAB POTENTIAL: Excellent  EVALUATION COMPLEXITY: Low      PLAN:  OT FREQUENCY: one time visit  OT DURATION: 1 sessions  PLANNED INTERVENTIONS: self care/ADL training, therapeutic exercise, therapeutic activity, neuromuscular re-education, manual therapy, scar mobilization, passive range of motion, splinting, ultrasound, fluidotherapy, compression bandaging, moist heat, cryotherapy, contrast bath, patient/family education, energy conservation, coping strategies training, and Re-evaluation  RECOMMENDED OTHER SERVICES: none now   CONSULTED AND AGREED WITH PLAN OF CARE: Patient  PLAN FOR NEXT SESSION:    N/A    Melvenia Ada, OTR/L, CHT 01/23/2024, 11:27 AM

## 2024-01-23 ENCOUNTER — Encounter: Payer: Self-pay | Admitting: Rehabilitative and Restorative Service Providers"

## 2024-01-23 ENCOUNTER — Ambulatory Visit: Admitting: Rehabilitative and Restorative Service Providers"

## 2024-01-23 DIAGNOSIS — R600 Localized edema: Secondary | ICD-10-CM

## 2024-01-23 DIAGNOSIS — M25631 Stiffness of right wrist, not elsewhere classified: Secondary | ICD-10-CM

## 2024-01-23 DIAGNOSIS — M6281 Muscle weakness (generalized): Secondary | ICD-10-CM

## 2024-01-29 ENCOUNTER — Encounter: Payer: Self-pay | Admitting: *Deleted

## 2024-01-29 DIAGNOSIS — Z006 Encounter for examination for normal comparison and control in clinical research program: Secondary | ICD-10-CM

## 2024-01-29 NOTE — Research (Signed)
 Message left for Vanessa [name] to call me back to schedule next visit.

## 2024-01-30 NOTE — Progress Notes (Unsigned)
" ° °  Vanessa [name] - 73 y.o. female MRN [id number]  Date of birth: 1950/11/19  Office Visit Note: Visit Date: 01/31/2024 PCP: Sari Bar Referred by: Sari Bar  Subjective:  HPI: Vanessa [name] is a 73 y.o. female who presents today for follow up 2 weeks status post right wrist endoscopic carpal tunnel release. Doing well overall, pain controlled, sensation in tact.  Pertinent ROS were reviewed with the patient and found to be negative unless otherwise specified above in HPI.   Assessment & Plan: Visit Diagnoses:  1. Carpal tunnel syndrome, right upper limb   2. S/P carpal tunnel release     Plan: Vanessa [name] is doing well postoperatively. She has asked to return to golf, which I believe is appropriate at this time. She has completed one visit of Occupational therapy and plans on doing HEP as needed. She will return to me in approximately 4 weeks for a clinical recheck.  Follow-up: No follow-ups on file.   Meds & Orders: No orders of the defined types were placed in this encounter.  No orders of the defined types were placed in this encounter.    Procedures: No procedures performed       Objective:   Vital Signs: There were no vitals taken for this visit.  Ortho Exam Right hand: - Well-healing forearm incision, sutures in place, skin edges well-approximated without erythema or drainage - Composite fist without restriction - 2 point discrimination in median nerve distribution is intact - 5/5 APB No thenar atrophy   Imaging: No results found.   Joesph Dinsmore, OPA-C [facility]  "

## 2024-01-31 ENCOUNTER — Ambulatory Visit: Admitting: Orthopedic Surgery

## 2024-01-31 DIAGNOSIS — G5601 Carpal tunnel syndrome, right upper limb: Secondary | ICD-10-CM

## 2024-01-31 DIAGNOSIS — Z9889 Other specified postprocedural states: Secondary | ICD-10-CM

## 2024-02-07 ENCOUNTER — Other Ambulatory Visit: Payer: Self-pay | Admitting: Physician Assistant

## 2024-02-12 ENCOUNTER — Encounter: Admitting: *Deleted

## 2024-02-12 VITALS — BP 138/70 | HR 67 | Temp 97.9°F | Resp 16 | Ht 64.0 in | Wt 165.0 lb

## 2024-02-12 DIAGNOSIS — Z006 Encounter for examination for normal comparison and control in clinical research program: Secondary | ICD-10-CM

## 2024-02-12 NOTE — Research (Signed)
 "    Subject Name: Vanessa [name]  Subject met inclusion and exclusion criteria.  The informed consent form, study requirements and expectations were reviewed with the subject and questions and concerns were addressed prior to the signing of the consent form.  The subject verbalized understanding of the trial requirements.  The subject agreed to participate in the pre-event  trial and signed the informed consent at 0900 on 12-Feb-2024.  The informed consent was obtained prior to performance of any protocol-specific procedures for the subject.  A copy of the signed informed consent was given to the subject and a copy was placed in the subject's medical record.   Vanessa [name]   Pre-Event Main Screening  Date of Visit: 12-Feb-2024                     Subject Number:[id number]  During this visit the following activities were completed:  [x]  Reading, Signing and Understanding the informed Consent   [x]  Medical History                                         [x]  Smoking History (Prior, or concurrent use) former  [x]  Vital signs     Arm: left                               [x]  Adverse events      BP:138/70      HR: 67                                                       [x]  Serious adverse device effects      Resp: 16                                                                                      Temp: 36.6                                                 [x]  Concomitant therapies reviewed      O2 Sat:98%      Height:5 ft 4 in                                                     Weight:1365 lbs      Waist circumference:38 in   [x]  Central Labs        []  Urine Preg test        []  Serum Preg test        []  FSH (only if needed to determine  childbearing potential)         [x]  Fasting glucose         [x]  HbA1c        [x]  Fasting lipid panel & apolipoproteins        [x]  Coag        [x]  Chemistry        [x]  Hematology        [x]  Hs-CRP  Vanessa [name] was placed in a quite  room. She was given time to ask questions, and read over the consent. She reports having right carpal tunnel surgery 01-31-2024, no other visits to the ed or urgent care. Vs taken at 0905. Blood drawn at 0926. Informed her I will call her with lab results and schedule her next appointment if she meets inclusion/exclusion. Voices understanding.  "

## 2024-02-15 ENCOUNTER — Encounter: Payer: Self-pay | Admitting: *Deleted

## 2024-02-15 DIAGNOSIS — Z006 Encounter for examination for normal comparison and control in clinical research program: Secondary | ICD-10-CM

## 2024-02-15 NOTE — Research (Signed)
 Message left for Ms Gillyard to schedule next research appointment.

## 2024-02-29 ENCOUNTER — Ambulatory Visit: Admitting: Family Medicine

## 2024-02-29 ENCOUNTER — Encounter: Payer: Self-pay | Admitting: Family Medicine

## 2024-02-29 VITALS — BP 130/70 | HR 69 | Temp 97.6°F | Ht 64.0 in | Wt 163.6 lb

## 2024-02-29 DIAGNOSIS — I10 Essential (primary) hypertension: Secondary | ICD-10-CM | POA: Diagnosis not present

## 2024-02-29 DIAGNOSIS — N1832 Chronic kidney disease, stage 3b: Secondary | ICD-10-CM

## 2024-02-29 DIAGNOSIS — Z7689 Persons encountering health services in other specified circumstances: Secondary | ICD-10-CM | POA: Diagnosis not present

## 2024-02-29 DIAGNOSIS — E782 Mixed hyperlipidemia: Secondary | ICD-10-CM

## 2024-02-29 DIAGNOSIS — Z780 Asymptomatic menopausal state: Secondary | ICD-10-CM

## 2024-02-29 DIAGNOSIS — C541 Malignant neoplasm of endometrium: Secondary | ICD-10-CM | POA: Insufficient documentation

## 2024-02-29 DIAGNOSIS — M1990 Unspecified osteoarthritis, unspecified site: Secondary | ICD-10-CM | POA: Insufficient documentation

## 2024-02-29 NOTE — Patient Instructions (Addendum)
 Welcome to [facility]!  Thank you for choosing us  for your Primary Care needs.   We offer in person and video appointments for your convenience. You may call our office to schedule appointments, or you may schedule appointments with me through MyChart.   The best way to get in contact with me is via MyChart message. This will get to me faster than a phone call, unless there is an emergency, then please call 911.  The lab is located downstairs in the Sports Medicine building, we also have xray available there.   Make an appointment for [facility] on your way out.

## 2024-02-29 NOTE — Progress Notes (Signed)
 "  New Patient Visit  Subjective:     Patient ID: Vanessa [name], female    DOB: [birth year], 74 y.o.   MRN: [id number]  No chief complaint on file.   HPI  Discussed the use of AI scribe software for clinical note transcription with the patient, who gave verbal consent to proceed.  History of Present Illness Vanessa [name] is a 74 year old female who presents to establish care with a new primary care provider.  Care coordination and specialist involvement - Establishing care with a new primary care provider after previous provider retired. - Seeking care closer to home in [facility] for improved care coordination. - Followed by nephrology for blood pressure management. - Followed by cardiology for cardiac risk management, including high cardiac CT calcium  score. - Participating in an LP(a) research study; uncertain if receiving active drug or placebo. - Followed by gynecology and orthopedics.  Cardiovascular risk management - High cardiac CT calcium  score. - On atorvastatin  80 mg and ezetimibe  as directed by cardiology.  Blood pressure management - Blood pressure managed by nephrology.  Recent upper respiratory illness - Prolonged cough and fatigue for approximately two months following recent travel. - Diagnosed with viral infection at urgent care. - Influenza A and B testing negative.  Preventive health measures - Stays current with vaccinations.     ROS Per HPI  Outpatient Encounter Medications as of 02/29/2024  Medication Sig   atorvastatin  (LIPITOR) 80 MG tablet Take 1 tablet (80 mg total) by mouth daily.   [facility] ASPIRIN  ADULT LOW DOSE 81 MG chewable tablet CHEW 1 TABLET BY MOUTH DAILY.   Cyanocobalamin (B-12 PO) Take 1 capsule by mouth 3 (three) times a week.   ezetimibe  (ZETIA ) 10 MG tablet Take 1 tablet (10 mg total) by mouth daily.   famotidine  (PEPCID ) 20 MG tablet Take 20 mg by mouth 2 (two) times daily.   furosemide  (LASIX ) 20 MG  tablet Take 20 mg by mouth daily.   hydrALAZINE  (APRESOLINE ) 100 MG tablet Take 100 mg by mouth 3 (three) times daily.   levothyroxine  (SYNTHROID ) 150 MCG tablet Take 150 mcg by mouth daily before breakfast.   loratadine  (CLARITIN ) 10 MG tablet Take 10 mg by mouth daily as needed for allergies.   metoprolol  succinate (TOPROL -XL) 25 MG 24 hr tablet TAKE 1 TABLET EVERY DAY   mirtazapine (REMERON) 15 MG tablet Take 15 mg by mouth at bedtime.   Multiple Vitamins-Minerals (HAIR SKIN AND NAILS FORMULA) TABS Take 1-2 tablets by mouth See admin instructions. Take 1 tablet on days when taking the b12 and 2 tablets on the non-b12 days   NIFEdipine  (PROCARDIA  XL/NIFEDICAL XL) 60 MG 24 hr tablet Take 60 mg by mouth 2 (two) times daily.   venlafaxine  XR (EFFEXOR -XR) 75 MG 24 hr capsule Take 75 mg by mouth daily with breakfast.   VITAMIN D PO Take 1 capsule by mouth 3 (three) times a week.   [DISCONTINUED] azelastine  (ASTELIN ) 0.1 % nasal spray Place 1 spray into both nostrils 2 (two) times daily. Use in each nostril as directed (Patient not taking: Reported on 02/12/2024)   [DISCONTINUED] oxyCODONE  (ROXICODONE ) 5 MG immediate release tablet Take 1 tablet (5 mg total) by mouth every 6 (six) hours as needed. (Patient not taking: Reported on 02/12/2024)   [DISCONTINUED] sodium bicarbonate  650 MG tablet Take 650 mg by mouth 2 (two) times daily.   No facility-administered encounter medications on file as of 02/29/2024.    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 but no obvious stenosis or regurgitation 12/04/17 echo ([facility])   Hepatitis    History of kidney stones    Hyperlipidemia    Hypertension    Hypothyroidism    PONV  (postoperative nausea and vomiting)     Past Surgical History:  Procedure Laterality Date   ABDOMINAL HYSTERECTOMY  2008   BREAST SURGERY Right 06/20/2011   lumpectomy for atypical ductal hyperplasia   DECOMPRESSIVE LUMBAR LAMINECTOMY LEVEL 1 N/A 09/08/2023   Procedure: DECOMPRESSIVE LUMBAR LAMINECTOMY LEVEL 1;  Surgeon: Georgina Ozell LABOR, MD;  Location: [facility] OR;  Service: Orthopedics;  Laterality: N/A;  L4-5 LAMINECTOMY   SHOULDER ARTHROSCOPY WITH ROTATOR CUFF REPAIR AND SUBACROMIAL DECOMPRESSION Right 01/29/2021   Procedure: RIGHT SHOULDER ARTHROSCOPY, DEBRIDEMENT, WITH MINI OPEN ROTATOR CUFF TEAR REPAIR & BICEPS TENODESIS;  Surgeon: Addie Cordella Hamilton, MD;  Location: [facility] OR;  Service: Orthopedics;  Laterality: Right;   SPINE SURGERY     TONSILLECTOMY     TUBAL LIGATION      Family History  Problem Relation Age of Onset   Heart disease Father    Colon cancer Neg Hx    Rectal cancer Neg Hx    Stomach cancer Neg Hx     Social History   Socioeconomic History   Marital status: Married    Spouse name: Not on file   Number of children: Not on file   Years of education: Not on file   Highest education level: Master's degree (e.g., MA, MS, MEng, MEd, MSW, MBA)  Occupational History   Not on file  Tobacco Use   Smoking status: Former    Current packs/day: 0.00    Average packs/day: 2.0 packs/day for 15.0 years (30.0 ttl pk-yrs)    Types: Cigarettes    Quit date: 08/13/1990    Years since quitting: 33.5   Smokeless tobacco: Never  Vaping Use   Vaping status: Never Used  Substance and Sexual Activity   Alcohol use: Yes    Alcohol/week: 4.0 standard drinks of alcohol    Types: 2 Glasses of wine, 2 Standard drinks or equivalent per week    Comment: 1-2 drinks/wk when not taking Tramadol   Drug use: Not Currently    Types: Marijuana   Sexual activity: Not Currently    Birth control/protection: None    Comment: Lesbian  Other Topics Concern   Not on file  Social History  Narrative   Not on file   Social Drivers of Health   Tobacco Use: Medium Risk (02/29/2024)   Patient History    Smoking Tobacco Use: Former    Smokeless Tobacco Use: Never    Passive Exposure: Not on file  Financial Resource Strain: Low Risk (02/25/2024)   Overall Financial Resource Strain (CARDIA)    Difficulty of Paying Living Expenses: Not hard at all  Food Insecurity: No Food Insecurity (02/25/2024)   Epic    Worried About Radiation Protection Practitioner of Food in the Last Year: Never true    Ran Out of Food in the Last Year: Never true  Transportation Needs: No Transportation Needs (02/25/2024)  Epic    Lack of Transportation (Medical): No    Lack of Transportation (Non-Medical): No  Physical Activity: Insufficiently Active (02/25/2024)   Exercise Vital Sign    Days of Exercise per Week: 2 days    Minutes of Exercise per Session: 40 min  Stress: No Stress Concern Present (02/25/2024)   [employer] of Occupational Health - Occupational Stress Questionnaire    Feeling of Stress: Only a little  Social Connections: Moderately Isolated (02/25/2024)   Social Connection and Isolation Panel    Frequency of Communication with Friends and Family: Twice a week    Frequency of Social Gatherings with Friends and Family: Once a week    Attends Religious Services: Never    Database Administrator or Organizations: No    Attends Engineer, Structural: Not on file    Marital Status: Married  Catering Manager Violence: Not At Risk (09/08/2023)   Epic    Fear of Current or Ex-Partner: No    Emotionally Abused: No    Physically Abused: No    Sexually Abused: No  Depression (PHQ2-9): Low Risk (02/29/2024)   Depression (PHQ2-9)    PHQ-2 Score: 0  Alcohol Screen: Low Risk (02/25/2024)   Alcohol Screen    Last Alcohol Screening Score (AUDIT): 3  Housing: Unknown (02/25/2024)   Epic    Unable to Pay for Housing in the Last Year: No    Number of Times Moved in the Last Year: Not on file    Homeless in  the Last Year: No  Utilities: Low Risk (02/20/2024)   Received from [facility]   Utilities    In the past 12 months has the electric, gas, oil, or water  company threatened to shut off services in your home? : No  Health Literacy: Not on file       Objective:    BP 130/70 (BP Location: Left Arm, Patient Position: Sitting)   Pulse 69   Temp 97.6 F (36.4 C) (Temporal)   Ht 5' 4 (1.626 m)   Wt 163 lb 9.6 oz (74.2 kg)   SpO2 96%   BMI 28.08 kg/m    Physical Exam Vitals and nursing note reviewed.  Constitutional:      General: She is not in acute distress.    Appearance: Normal appearance. She is normal weight.  HENT:     Head: Normocephalic and atraumatic.     Right Ear: External ear normal.     Left Ear: External ear normal.     Nose: Nose normal.     Mouth/Throat:     Mouth: Mucous membranes are moist.     Pharynx: Oropharynx is clear.  Eyes:     Extraocular Movements: Extraocular movements intact.     Pupils: Pupils are equal, round, and reactive to light.  Cardiovascular:     Rate and Rhythm: Normal rate and regular rhythm.     Pulses: Normal pulses.     Heart sounds: Normal heart sounds.  Pulmonary:     Effort: Pulmonary effort is normal. No respiratory distress.     Breath sounds: Normal breath sounds. No wheezing, rhonchi or rales.  Musculoskeletal:        General: Normal range of motion.     Cervical back: Normal range of motion.     Right lower leg: No edema.     Left lower leg: No edema.  Lymphadenopathy:     Cervical: No cervical adenopathy.  Neurological:     General:  No focal deficit present.     Mental Status: She is alert and oriented to person, place, and time.  Psychiatric:        Mood and Affect: Mood normal.        Thought Content: Thought content normal.     No results found for any visits on 02/29/24.      Assessment & Plan:   Assessment and Plan Assessment & Plan Establishment of care, postmenopausal estrogen deficiency  new  primary care provider due to previous provider's retirement.  Due for bone density scan. Vaccinations current. Hepatitis C screening not required. - Ordered bone density scan. - Schedule annual wellness visit   Essential Hypertension, stage 3b CKD Managed by nephrology and cardiology for hypertension and hyperlipidemia. Enrolled in cardiology research study on LP(a) levels. - Continue management with nephrology and cardiology. - Participate in cardiology research study.  Mixed HLD - chronic, stable, followed by cardiology - continue zetia  and atorvastatin       Orders Placed This Encounter  Procedures   DG Bone Density [PFH7522]    Standing Status:   Future    Expiration Date:   02/28/2025    Reason for Exam (SYMPTOM  OR DIAGNOSIS REQUIRED):   post menopausal estrogen deficiency    Preferred imaging location?:   [address]     No orders of the defined types were placed in this encounter.   Return in about 1 year (around 02/28/2025) for cpe, sooner if needed.  Corean LITTIE Ku, FNP   "

## 2024-03-04 ENCOUNTER — Encounter: Admitting: Orthopedic Surgery

## 2024-03-05 ENCOUNTER — Encounter: Payer: Self-pay | Admitting: *Deleted

## 2024-03-05 ENCOUNTER — Ambulatory Visit

## 2024-03-05 ENCOUNTER — Encounter

## 2024-03-05 VITALS — Ht 64.0 in | Wt 163.0 lb

## 2024-03-05 DIAGNOSIS — Z0000 Encounter for general adult medical examination without abnormal findings: Secondary | ICD-10-CM | POA: Diagnosis not present

## 2024-03-05 DIAGNOSIS — Z006 Encounter for examination for normal comparison and control in clinical research program: Secondary | ICD-10-CM

## 2024-03-05 NOTE — Progress Notes (Signed)
 "  Chief Complaint  Patient presents with   Medicare Wellness     Subjective:   Vanessa [name] is a 74 y.o. female who presents for a Medicare Annual Wellness Visit.  Visit info / Clinical Intake: Medicare Wellness Visit Type:: Subsequent Annual Wellness Visit Persons participating in visit and providing information:: patient Medicare Wellness Visit Mode:: Video Since this visit was completed virtually, some vitals may be partially provided or unavailable. Missing vitals are due to the limitations of the virtual format.: Unable to obtain vitals - no equipment If Telephone or Video please confirm:: I connected with patient using audio/video enable telemedicine. I verified patient identity with two identifiers, discussed telehealth limitations, and patient agreed to proceed. Patient Location:: Home Provider Location:: Home Interpreter Needed?: No Pre-visit prep was completed: yes AWV questionnaire completed by patient prior to visit?: yes Date:: 03/02/24 Living arrangements:: (Patient-Rptd) lives with spouse/significant other Patient's Overall Health Status Rating: (!) (Patient-Rptd) fair Typical amount of pain: (Patient-Rptd) some Does pain affect daily life?: (Patient-Rptd) no Are you currently prescribed opioids?: no  Dietary Habits and Nutritional Risks How many meals a day?: (Patient-Rptd) 2 Eats fruit and vegetables daily?: (Patient-Rptd) yes Most meals are obtained by: (Patient-Rptd) preparing own meals In the last 2 weeks, have you had any of the following?: none Diabetic:: no  Functional Status Activities of Daily Living (to include ambulation/medication): (Patient-Rptd) Independent Ambulation: (Patient-Rptd) Independent Home Assistive Devices/Equipment: Eyeglasses Medication Administration: (Patient-Rptd) Independent Home Management (perform basic housework or laundry): (Patient-Rptd) Independent Manage your own finances?: (Patient-Rptd) yes Primary  transportation is: (Patient-Rptd) driving Concerns about vision?: no *vision screening is required for WTM* Concerns about hearing?: no  Fall Screening Falls in the past year?: (Patient-Rptd) 0 Number of falls in past year: 0 Was there an injury with Fall?: 0 Fall Risk Category Calculator: 0 Patient Fall Risk Level: Low Fall Risk  Fall Risk Patient at Risk for Falls Due to: No Fall Risks Fall risk Follow up: Falls evaluation completed; Falls prevention discussed  Home and Transportation Safety: All rugs have non-skid backing?: (Patient-Rptd) yes All stairs or steps have railings?: (Patient-Rptd) yes Grab bars in the bathtub or shower?: (!) (Patient-Rptd) no Have non-skid surface in bathtub or shower?: (Patient-Rptd) yes Good home lighting?: (Patient-Rptd) yes Regular seat belt use?: (Patient-Rptd) yes Hospital stays in the last year:: (!) (Patient-Rptd) yes How many hospital stays:: (Patient-Rptd) 1  Cognitive Assessment Difficulty concentrating, remembering, or making decisions? : (Patient-Rptd) no Will 6CIT or Mini Cog be Completed: no 6CIT or Mini Cog Declined: patient alert, oriented, able to answer questions appropriately and recall recent events  Advance Directives (For Healthcare) Does Patient Have a Medical Advance Directive?: Yes Does patient want to make changes to medical advance directive?: No - Patient declined Type of Advance Directive: Healthcare Power of [place]; Living will Copy of Healthcare Power of Attorney in Chart?: No - copy requested Copy of Living Will in Chart?: No - copy requested  Reviewed/Updated  Reviewed/Updated: Reviewed All (Medical, Surgical, Family, Medications, Allergies, Care Teams, Patient Goals)    Allergies (verified) Lisinopril, Morphine, and Codeine   Current Medications (verified) Outpatient Encounter Medications as of 03/05/2024  Medication Sig   atorvastatin  (LIPITOR) 80 MG tablet Take 1 tablet (80 mg total) by mouth daily.    [facility] ASPIRIN  ADULT LOW DOSE 81 MG chewable tablet CHEW 1 TABLET BY MOUTH DAILY.   Cyanocobalamin (B-12 PO) Take 1 capsule by mouth 3 (three) times a week.   ezetimibe  (ZETIA ) 10 MG tablet Take 1  tablet (10 mg total) by mouth daily.   famotidine  (PEPCID ) 20 MG tablet Take 20 mg by mouth 2 (two) times daily.   furosemide  (LASIX ) 20 MG tablet Take 20 mg by mouth daily.   hydrALAZINE  (APRESOLINE ) 100 MG tablet Take 100 mg by mouth 3 (three) times daily.   levothyroxine  (SYNTHROID ) 150 MCG tablet Take 150 mcg by mouth daily before breakfast.   loratadine  (CLARITIN ) 10 MG tablet Take 10 mg by mouth daily as needed for allergies.   metoprolol  succinate (TOPROL -XL) 25 MG 24 hr tablet TAKE 1 TABLET EVERY DAY   mirtazapine (REMERON) 15 MG tablet Take 15 mg by mouth at bedtime.   Multiple Vitamins-Minerals (HAIR SKIN AND NAILS FORMULA) TABS Take 1-2 tablets by mouth See admin instructions. Take 1 tablet on days when taking the b12 and 2 tablets on the non-b12 days   NIFEdipine  (PROCARDIA  XL/NIFEDICAL XL) 60 MG 24 hr tablet Take 60 mg by mouth 2 (two) times daily.   venlafaxine  XR (EFFEXOR -XR) 75 MG 24 hr capsule Take 75 mg by mouth daily with breakfast.   VITAMIN D PO Take 1 capsule by mouth 3 (three) times a week.   No facility-administered encounter medications on file as of 03/05/2024.    History: 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 but no obvious stenosis or regurgitation 12/04/17 echo ([facility])   Hepatitis    History of kidney stones    Hyperlipidemia    Hypertension    Hypothyroidism    PONV (postoperative nausea and vomiting)    Past Surgical History:  Procedure Laterality Date   ABDOMINAL HYSTERECTOMY   2008   BREAST SURGERY Right 06/20/2011   lumpectomy for atypical ductal hyperplasia   DECOMPRESSIVE LUMBAR LAMINECTOMY LEVEL 1 N/A 09/08/2023   Procedure: DECOMPRESSIVE LUMBAR LAMINECTOMY LEVEL 1;  Surgeon: Georgina Ozell LABOR, MD;  Location: [facility] OR;  Service: Orthopedics;  Laterality: N/A;  L4-5 LAMINECTOMY   SHOULDER ARTHROSCOPY WITH ROTATOR CUFF REPAIR AND SUBACROMIAL DECOMPRESSION Right 01/29/2021   Procedure: RIGHT SHOULDER ARTHROSCOPY, DEBRIDEMENT, WITH MINI OPEN ROTATOR CUFF TEAR REPAIR & BICEPS TENODESIS;  Surgeon: Addie Cordella Hamilton, MD;  Location: [facility] OR;  Service: Orthopedics;  Laterality: Right;   SPINE SURGERY     TONSILLECTOMY     TUBAL LIGATION     Family History  Problem Relation Age of Onset   Heart disease Father    Colon cancer Neg Hx    Rectal cancer Neg Hx    Stomach cancer Neg Hx    Social History   Occupational History   Occupation: RETIRED engineer, civil (consulting)  Tobacco Use   Smoking status: Former    Current packs/day: 0.00    Average packs/day: 2.0 packs/day for 15.0 years (30.0 ttl pk-yrs)    Types: Cigarettes    Quit date: 08/13/1990    Years since quitting: 33.5   Smokeless tobacco: Never  Vaping Use   Vaping status: Never Used  Substance and Sexual Activity   Alcohol use: Yes    Alcohol/week: 4.0 standard drinks of alcohol    Types: 2 Glasses of wine, 2 Standard drinks or equivalent per  week    Comment: 1-2 drinks/wk when not taking Tramadol   Drug use: Not Currently    Types: Marijuana   Sexual activity: Not Currently    Birth control/protection: None    Comment: Lesbian   Tobacco Counseling Counseling given: Not Answered  SDOH Screenings   Food Insecurity: No Food Insecurity (03/05/2024)  Housing: Unknown (03/05/2024)  Transportation Needs: No Transportation Needs (03/05/2024)  Utilities: Not At Risk (03/05/2024)  Alcohol Screen: Low Risk (02/25/2024)  Depression (PHQ2-9): Low Risk (03/05/2024)  Financial Resource Strain: Low Risk (02/25/2024)  Physical  Activity: Insufficiently Active (03/05/2024)  Social Connections: Moderately Isolated (03/05/2024)  Stress: No Stress Concern Present (03/05/2024)  Tobacco Use: Medium Risk (03/05/2024)  Health Literacy: Adequate Health Literacy (03/05/2024)   See flowsheets for full screening details  Depression Screen PHQ 2 & 9 Depression Scale- Over the past 2 weeks, how often have you been bothered by any of the following problems? Little interest or pleasure in doing things: 0 Feeling down, depressed, or hopeless (PHQ Adolescent also includes...irritable): 0 PHQ-2 Total Score: 0 Trouble falling or staying asleep, or sleeping too much: 0 Feeling tired or having little energy: 0 Poor appetite or overeating (PHQ Adolescent also includes...weight loss): 0 Feeling bad about yourself - or that you are a failure or have let yourself or your family down: 0 Trouble concentrating on things, such as reading the newspaper or watching television (PHQ Adolescent also includes...like school work): 0 Moving or speaking so slowly that other people could have noticed. Or the opposite - being so fidgety or restless that you have been moving around a lot more than usual: 0 Thoughts that you would be better off dead, or of hurting yourself in some way: 0 PHQ-9 Total Score: 0 If you checked off any problems, how difficult have these problems made it for you to do your work, take care of things at home, or get along with other people?: Not difficult at all     Goals Addressed               This Visit's Progress     Patient Stated (pt-stated)        To get stronger/2026             Objective:    Today's Vitals   03/05/24 1425  Weight: 163 lb (73.9 kg)  Height: 5' 4 (1.626 m)   Body mass index is 27.98 kg/m.  Hearing/Vision screen Hearing Screening - Comments:: Denies hearing difficulties   Vision Screening - Comments:: Wears eyeglasses/ [facility]/UTD Immunizations and Health Maintenance Health  Maintenance  Topic Date Due   Hepatitis C Screening  Never done   Bone Density Scan  Never done   COVID-19 Vaccine (9 - Pfizer risk 2025-26 season) 04/26/2024   Medicare Annual Wellness (AWV)  03/05/2025   Mammogram  12/18/2025   DTaP/Tdap/Td (4 - Td or Tdap) 10/08/2030   Pneumococcal Vaccine: 50+ Years  Completed   Influenza Vaccine  Completed   Zoster Vaccines- Shingrix  Completed   Meningococcal B Vaccine  Aged Out   Colonoscopy  Discontinued        Assessment/Plan:  This is a routine wellness examination for Janaiyah.  Patient Care Team: Alvia Corean CROME, FNP as PCP - General (Family Medicine) Mona Vinie BROCKS, MD as PCP - Cardiology (Cardiology)  I have personally reviewed and noted the following in the patients chart:   Medical and social history Use of alcohol, tobacco or illicit drugs  Current medications and supplements including opioid prescriptions. Functional ability and status Nutritional status Physical activity Advanced directives List of other physicians Hospitalizations, surgeries, and ER visits in previous 12 months Vitals Screenings to include cognitive, depression, and falls Referrals and appointments  No orders of the defined types were placed in this encounter.  In addition, I have reviewed and discussed with patient certain preventive protocols, quality metrics, and best practice recommendations. A written personalized care plan for preventive services as well as general preventive health recommendations were provided to patient.   Shannel Zahm L Tan Clopper, CMA   03/05/2024   Return in 1 year (on 03/05/2025).  After Visit Summary: (MyChart) Due to this being a telephonic visit, the after visit summary with patients personalized plan was offered to patient via MyChart   Nurse Notes: Patient is due for a Hep C screening and will have that done during her next lab visit.  She had no concerns to address today. "

## 2024-03-05 NOTE — Research (Signed)
 Message left for Vanessa [name] to re schedule her appointment for pre-event.

## 2024-03-05 NOTE — Patient Instructions (Addendum)
 Vanessa [name],  Thank you for taking the time for your Medicare Wellness Visit. I appreciate your continued commitment to your health goals. Please review the care plan we discussed, and feel free to reach out if I can assist you further.  Please note that Annual Wellness Visits do not include a physical exam. Some assessments may be limited, especially if the visit was conducted virtually. If needed, we may recommend an in-person follow-up with your provider.  Ongoing Care Seeing your primary care provider every 3 to 6 months helps us  monitor your health and provide consistent, personalized care. Last office visit on 02/29/2024.  You are due for a Hep C screening and will have that done during your next office visit.  Keep up the good work.  Referrals If a referral was made during today's visit and you haven't received any updates within two weeks, please contact the referred provider directly to check on the status.  Recommended Screenings:  Health Maintenance  Topic Date Due   Hepatitis C Screening  Never done   Osteoporosis screening with Bone Density Scan  Never done   Medicare Annual Wellness Visit  12/07/2023   COVID-19 Vaccine (9 - Pfizer risk 2025-26 season) 04/26/2024   Breast Cancer Screening  12/18/2025   DTaP/Tdap/Td vaccine (4 - Td or Tdap) 10/08/2030   Pneumococcal Vaccine for age over 93  Completed   Flu Shot  Completed   Zoster (Shingles) Vaccine  Completed   Meningitis B Vaccine  Aged Out   Colon Cancer Screening  Discontinued       03/02/2024   12:37 PM  Advanced Directives  Does Patient Have a Medical Advance Directive? Yes  Type of Estate Agent of [place];Living will  Copy of Healthcare Power of Attorney in Chart? No - copy requested    Vision: Annual vision screenings are recommended for early detection of glaucoma, cataracts, and diabetic retinopathy. These exams can also reveal signs of chronic conditions such as diabetes and high blood  pressure.  Dental: Annual dental screenings help detect early signs of oral cancer, gum disease, and other conditions linked to overall health, including heart disease and diabetes.  Please see the attached documents for additional preventive care recommendations.

## 2024-03-06 ENCOUNTER — Ambulatory Visit: Admitting: Orthopedic Surgery

## 2024-03-06 DIAGNOSIS — Z9889 Other specified postprocedural states: Secondary | ICD-10-CM

## 2024-03-06 NOTE — Progress Notes (Signed)
 Orthopedic Surgery Post-operative Office Visit   Procedure: L4/5 laminectomy Date of Surgery: 09/08/2023 (~6 months post-op)   Assessment: Patient is a 74 y.o. who is doing well after surgery     Plan: -No spine specific precautions -Pain management: tylenol  as needed -Return to office on an as needed basis   ___________________________________________________________________________     Subjective: Patient continues to do well postoperatively.  She is not having any back or radiating leg pain.  She is not limited in any of her activities due to back or leg pain.  She is pleased with how she has done since surgery.   Objective:   General: no acute distress, appropriate affect Neurologic: alert, answering questions appropriately, following commands Respiratory: unlabored breathing on room air Skin: incision is well healed   MSK (spine):   -Strength exam                                                   Left                  Right   EHL                              5/5                  5/5 TA                                 5/5                  5/5 GSC                             5/5                  5/5 Knee extension            5/5                  5/5 Hip flexion                    5/5                  5/5   -Sensory exam                           Sensation intact to light touch in L3-S1 nerve distributions of bilateral lower extremities   Imaging: XRs of the lumbar spine from 12/04/2023 were previously independently reviewed and interpreted, showing disc at loss at L5/S1.  Laminectomy defect at L4/5.  No evidence of instability on flexion/extension views.  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  No fracture or dislocation seen.     Patient name: Vanessa [name] Patient MRN: [id number] Date of visit: 03/06/24

## 2024-03-07 ENCOUNTER — Ambulatory Visit: Admitting: Orthopedic Surgery

## 2024-03-07 DIAGNOSIS — G5601 Carpal tunnel syndrome, right upper limb: Secondary | ICD-10-CM

## 2024-03-07 DIAGNOSIS — Z9889 Other specified postprocedural states: Secondary | ICD-10-CM

## 2024-03-07 NOTE — Progress Notes (Signed)
" ° °  [name] - 74 y.o. female MRN [id number]  Date of birth: 11/26/1950  Office Visit Note: Visit Date: 03/07/2024 PCP: Alvia Corean CROME, FNP Referred by: Sari Bar  Subjective:  HPI: Graceyn Fodor is a 74 y.o. female who presents today for follow up 7 weeks status post right wrist endoscopic carpal tunnel release. She is doing well overall. She denies any pain and both the numbness and tingling has subsided.  Pertinent ROS were reviewed with the patient and found to be negative unless otherwise specified above in HPI.   Assessment & Plan: Visit Diagnoses:  1. S/P carpal tunnel release   2. Carpal tunnel syndrome, right upper limb     Plan: Haroldine is doing quite well. She has resumed normal activities with no issue. She has some residual soreness in the palm, but overall doing good. She can follow up with me as needed.   Follow-up: No follow-ups on file.   Meds & Orders: No orders of the defined types were placed in this encounter.  No orders of the defined types were placed in this encounter.    Procedures: No procedures performed       Objective:   Vital Signs: There were no vitals taken for this visit.  Ortho Exam Right wrist Incision well healed 5/5 grip strength  Imaging: No results found.   Joesph Dinsmore, OPA-C  "

## 2024-03-08 ENCOUNTER — Encounter: Admitting: *Deleted

## 2024-03-08 VITALS — BP 123/65 | HR 64 | Temp 98.1°F | Resp 18 | Wt 161.4 lb

## 2024-03-08 DIAGNOSIS — Z006 Encounter for examination for normal comparison and control in clinical research program: Secondary | ICD-10-CM

## 2024-03-08 MED ORDER — STUDY - OCEAN(A)-PRE-EVENT - OLPASIRAN 142 MG/ML OR PLACEBO SQ INJECTION (PI-HILTY)
142.0000 mg | PREFILLED_SYRINGE | SUBCUTANEOUS | Status: AC
  Filled 2024-03-08: qty 1

## 2024-03-08 NOTE — Progress Notes (Signed)
" ° ° °  Research Note  Date:  03/08/2024  ID:  Vanessa [name] [place], DOB Nov 23, 1950, MRN [id number] Study ID: [id number]) Pre-Event  Chief Complaint and HPI  Vanessa [name] is a 74 y.o. female here for day 1 evaluation for Ocean(A) Pre-Event study.  Today patient denies chest pain, shortness of breath, lower extremity edema, fatigue, palpitations, melena, hematuria, hemoptysis, diaphoresis, weakness, presyncope, syncope, orthopnea, and PND.    Studies Reviewed   EKG: NSR at 70 bpm         Physical Exam  VS:  BP 123/65 (BP Location: Left Arm, Patient Position: Sitting, Cuff Size: Normal)   Pulse 64   Temp 98.1 F (36.7 C)   Resp 18   Wt 161 lb 6.4 oz (73.2 kg)   SpO2 100%   BMI 27.70 kg/m    Wt Readings from Last 3 Encounters:  03/08/24 161 lb 6.4 oz (73.2 kg)  03/05/24 163 lb (73.9 kg)  02/29/24 163 lb 9.6 oz (74.2 kg)     General: Well developed, well nourished, appearing in no acute distress. Head: Normocephalic, atraumatic.  Neck: Supple without bruits, JVD. Lungs:  Resp regular and unlabored, CTA. Heart: RRR, S1, S2, no S3, S4, or murmur; no rub. Abdomen: Soft, non-tender, non-distended with normoactive bowel sounds. No hepatomegaly. No rebound/guarding. No obvious abdominal masses. Extremities: No clubbing, cyanosis, No edema. Distal pedal pulses are 2+ bilaterally. Neuro: Alert and oriented X 3. Moves all extremities spontaneously. Psych: Normal affect.   Assessment and Plan   The patient was given the opportunity to ask any further questions about the study that were not addressed by the research nurse coordinators - they have no further questions and want to proceed at this time.  Signed, Shelda Bruckner, MD, PhD 03/08/2024, 3:56 PM [phone] [facility] [address] [place], [place] [address]          "

## 2024-03-08 NOTE — Research (Signed)
 Pre-Event Day 1 Visit  Date of Visit: 08-Mar-2024                        Subject Number:[id number] During this visit the following activities were completed:  [x]   Enrollment/randomization      [x]  Review inclusion and exclusion  [x]  Medical History      [x]  Vital signs        Jmf:ozqu        BP: 123/65       HR: 64       Resp:18         Temp:98.1       O2 Sat: 100%  [x]  Physical Exam Dr Lonni  [x]  EKG  [x]  Adverse events  [x]  Serious adverse device effects  [x] Vital Status/ non-fatal PEPs  [x]  Concomitant therapies reviewed  [x]  Central Labs       []  Urine Preg       [x]  Fasting glucose       [x]  HbA1c       [x] Fasting lipid panel & apolipoproteins       [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 She reports no visits to the ed or urgent care, no changes in her meds, and no abd pain. VS taken at 0917. Urine obtained at 1004. Blood drawn at 0933. Injection given at 1005. Tol well kit number [id number]. Post blood drawn at 1105. Scheduled Week 4 visit for March 4 at 0900.     Current Medications[1]     [1]  Current Outpatient Medications:    atorvastatin  (LIPITOR) 80 MG tablet, Take 1 tablet (80 mg total) by mouth daily., Disp: 90 tablet, Rfl: 1   [facility] ASPIRIN  ADULT LOW DOSE 81 MG chewable tablet, CHEW 1 TABLET BY MOUTH DAILY., Disp: 36 tablet, Rfl: 0   Cyanocobalamin (B-12 PO), Take 1 capsule by mouth 3 (three) times a week., Disp: , Rfl:    ezetimibe  (ZETIA ) 10 MG tablet, Take 1 tablet (10 mg total) by mouth daily., Disp: 90 tablet, Rfl: 3   famotidine   (PEPCID ) 20 MG tablet, Take 20 mg by mouth 2 (two) times daily., Disp: , Rfl:    furosemide  (LASIX ) 20 MG tablet, Take 20 mg by mouth daily., Disp: , Rfl:    hydrALAZINE  (APRESOLINE ) 100 MG tablet, Take 100 mg by mouth 3 (three) times daily., Disp: , Rfl:    levothyroxine  (SYNTHROID ) 150 MCG tablet, Take 150 mcg by mouth daily before breakfast., Disp: , Rfl:    loratadine  (CLARITIN ) 10 MG tablet, Take 10 mg by mouth daily as needed for allergies., Disp: , Rfl:    metoprolol  succinate (TOPROL -XL) 25 MG 24 hr tablet, TAKE 1 TABLET EVERY DAY, Disp: 90 tablet, Rfl: 0   mirtazapine (REMERON) 15 MG tablet, Take 15 mg by mouth at bedtime., Disp: , Rfl:    Multiple Vitamins-Minerals (  HAIR SKIN AND NAILS FORMULA) TABS, Take 1-2 tablets by mouth See admin instructions. Take 1 tablet on days when taking the b12 and 2 tablets on the non-b12 days, Disp: , Rfl:    NIFEdipine  (PROCARDIA  XL/NIFEDICAL XL) 60 MG 24 hr tablet, Take 60 mg by mouth 2 (two) times daily., Disp: , Rfl:    venlafaxine  XR (EFFEXOR -XR) 75 MG 24 hr capsule, Take 75 mg by mouth daily with breakfast., Disp: , Rfl:    VITAMIN D PO, Take 1 capsule by mouth 3 (three) times a week., Disp: , Rfl:   Current Facility-Administered Medications:    Study - OCEAN(A)-PRE-EVENT - olpasiran 142 mg/mL or placebo SQ injection (PI-Hilty), 142 mg, Subcutaneous, Q90 days,

## 2024-03-11 ENCOUNTER — Encounter

## 2024-04-10 ENCOUNTER — Encounter

## 2024-05-20 ENCOUNTER — Ambulatory Visit: Admitting: Internal Medicine
